# Patient Record
Sex: Male | Born: 1970
Health system: Southern US, Community
[De-identification: ages and names within clinical notes are randomized; demographics above are authoritative.]

## PROBLEM LIST (undated history)

## (undated) DIAGNOSIS — T7840XA Allergy, unspecified, initial encounter: Secondary | ICD-10-CM

## (undated) HISTORY — PX: SUBDURAL HEMATOMA EVACUATION VIA CRANIOTOMY: SUR319

## (undated) HISTORY — DX: Allergy, unspecified, initial encounter: T78.40XA

---

## 2011-08-11 ENCOUNTER — Ambulatory Visit (INDEPENDENT_AMBULATORY_CARE_PROVIDER_SITE_OTHER): Payer: 59

## 2011-08-11 DIAGNOSIS — R1084 Generalized abdominal pain: Secondary | ICD-10-CM

## 2011-08-11 DIAGNOSIS — K5900 Constipation, unspecified: Secondary | ICD-10-CM

## 2012-03-12 ENCOUNTER — Ambulatory Visit: Payer: 59

## 2012-03-12 ENCOUNTER — Ambulatory Visit (INDEPENDENT_AMBULATORY_CARE_PROVIDER_SITE_OTHER): Payer: 59 | Admitting: Family Medicine

## 2012-03-12 VITALS — BP 102/64 | HR 94 | Temp 99.6°F | Resp 16 | Ht 73.5 in | Wt 178.8 lb

## 2012-03-12 DIAGNOSIS — L02619 Cutaneous abscess of unspecified foot: Secondary | ICD-10-CM

## 2012-03-12 DIAGNOSIS — L03116 Cellulitis of left lower limb: Secondary | ICD-10-CM

## 2012-03-12 DIAGNOSIS — T6391XA Toxic effect of contact with unspecified venomous animal, accidental (unintentional), initial encounter: Secondary | ICD-10-CM

## 2012-03-12 LAB — POCT CBC
Granulocyte percent: 75.9 %G (ref 37–80)
HCT, POC: 46.2 % (ref 43.5–53.7)
Hemoglobin: 14.4 g/dL (ref 14.1–18.1)
Lymph, poc: 1.5 (ref 0.6–3.4)
MCV: 96.6 fL (ref 80–97)
POC Granulocyte: 7.1 — AB (ref 2–6.9)
POC LYMPH PERCENT: 16.1 %L (ref 10–50)

## 2012-03-12 MED ORDER — DOXYCYCLINE HYCLATE 100 MG PO TABS: 100.0000 mg | ORAL_TABLET | Freq: Two times a day (BID) | ORAL | Status: AC

## 2012-03-12 MED ORDER — LEVOFLOXACIN 500 MG PO TABS: 500.0000 mg | ORAL_TABLET | Freq: Every day | ORAL | Status: AC

## 2012-03-12 MED ORDER — CLINDAMYCIN HCL 150 MG PO CAPS: 150.0000 mg | ORAL_CAPSULE | Freq: Three times a day (TID) | ORAL | Status: AC

## 2012-03-12 NOTE — Progress Notes (Signed)
Subjective: Patient [place] the beach [place] and walking to the store. After about 5 minutes case stain on his foot. He thinks a sting ray got him with its tail.  He did well for most of the week, then yesterday started getting swollen and red. He got more painful. He had initial pain but that subsided.  Objective: 4 mm puncture wound on his foot. He has redness of much of the foot up to the malleolus. No radiating up the leg. Does not feel like it is abscess enough to benefit from an I&D. Assessment: Stingray sting with secondary cellulitis  Plan: X-ray soft tissues CBC Photograph Cultured the drainage Treat with triple therapy per Up to Date. (clindamycin plus levofloxacin. Patients with seawater exposure should also receive doxycycline for coverage of Vibrio species) Return in 2 days, sooner if worse Tetanus is utd.  [facility] reading (PRIMARY) by  Dr. Alwyn Ren No FB seen on medial aspect of foot where entry wound is.  There is a little radioopaque area on the dorsum of the foot, but this is not where the puncture wound is. .  Results for orders placed in visit on 03/12/12  POCT CBC      Component Value Range   WBC 9.3  4.6 - 10.2 K/uL   Lymph, poc 1.5  0.6 - 3.4   POC LYMPH PERCENT 16.1  10 - 50 %L   MID (cbc) 0.7  0 - 0.9   POC MID % 8.0  0 - 12 %M   POC Granulocyte 7.1 (*) 2 - 6.9   Granulocyte percent 75.9  37 - 80 %G   RBC 4.78  4.69 - 6.13 M/uL   Hemoglobin 14.4  14.1 - 18.1 g/dL   HCT, POC 47.8  29.5 - 53.7 %   MCV 96.6  80 - 97 fL   MCH, POC 30.1  27 - 31.2 pg   MCHC 31.2 (*) 31.8 - 35.4 g/dL   RDW, POC 62.1     Platelet Count, POC 222  142 - 424 K/uL   MPV 8.4  0 - 99.8 fL

## 2012-03-12 NOTE — Patient Instructions (Addendum)
Return at any time if abruptly worse. Return Thursday for a recheck.

## 2012-03-14 ENCOUNTER — Encounter: Payer: Self-pay | Admitting: Family Medicine

## 2012-03-15 LAB — WOUND CULTURE
Gram Stain: NONE SEEN
Gram Stain: NONE SEEN

## 2012-03-31 ENCOUNTER — Ambulatory Visit (INDEPENDENT_AMBULATORY_CARE_PROVIDER_SITE_OTHER): Payer: 59 | Admitting: Emergency Medicine

## 2012-03-31 VITALS — BP 99/70 | HR 70 | Temp 98.1°F | Resp 14 | Ht 73.5 in | Wt 176.0 lb

## 2012-03-31 DIAGNOSIS — L089 Local infection of the skin and subcutaneous tissue, unspecified: Secondary | ICD-10-CM

## 2012-03-31 MED ORDER — CIPROFLOXACIN HCL 500 MG PO TABS: 500.0000 mg | ORAL_TABLET | Freq: Two times a day (BID) | ORAL | Status: AC

## 2012-03-31 NOTE — Progress Notes (Signed)
   Date:  03/31/2012   Name:  Chad Griffin   DOB:  06-03-1971   MRN:  [id number] Gender: male Age: 41 y.o.  PCP:  Tonye Pearson, MD    Chief Complaint: Cellulitis   History of Present Illness:  Chad Griffin is a 41 y.o. pleasant patient who presents with the following:  Treated by Dr Alwyn Ren a week after stepping on a stingray at the beach.  Treated with a combination of levaquin, clindamycin, and doxycycline.  Now has increased erythema and some sero-sanguinous drainage from the wound today.  Has completed his antibiotic course 1 1/2 weeks ago.  No fever or chills, no swelling or tenderness.  Is concerned by the drainage and increased redness.    There is no problem list on file for this patient.   No past medical history on file.  No past surgical history on file.  History  Substance Use Topics  . Smoking status: Never Smoker   . Smokeless tobacco: Not on file  . Alcohol Use: Not on file    No family history on file.  Allergies  Allergen Reactions  . Penicillins Swelling    Medication list has been reviewed and updated.  No current outpatient prescriptions on file prior to visit.    Review of Systems:  As per HPI, otherwise negative.    Physical Examination: Filed Vitals:   03/31/12 1225  BP: 99/70  Pulse: 70  Temp: 98.1 F (36.7 C)  Resp: 14   Filed Vitals:   03/31/12 1225  Height: 6' 1.5" (1.867 m)  Weight: 176 lb (79.833 kg)   Body mass index is 22.91 kg/(m^2). Ideal Body Weight: Weight in (lb) to have BMI = 25: 191.7    GEN: WDWN, NAD, Non-toxic, Alert & Oriented x 3 HEENT: Atraumatic, Normocephalic.  Ears and Nose: No external deformity. EXTR: No clubbing/cyanosis/edema NEURO: Normal gait.  PSYCH: Normally interactive. Conversant. Not depressed or anxious appearing.  Calm demeanor.  Left Foot:  No cellulitis or lymphangitis.  Some erythema but no fluctuance or tenderness.  No mass.  No drainage.   Assessment and  Plan: Stingray sting cipro Follow up as needed  Had C&S positive for staph sensitive to levaquin, doxycycline, and clindamycin.  Had xray negative for foreign body.  Will give course of cipro as it is easier on the stomach and effective against V.vulnificans.  Chad Dane, MD I have reviewed and agree with documentation. Robert P. Merla Riches, M.D.

## 2012-04-23 DIAGNOSIS — Z0271 Encounter for disability determination: Secondary | ICD-10-CM

## 2012-07-13 ENCOUNTER — Ambulatory Visit (INDEPENDENT_AMBULATORY_CARE_PROVIDER_SITE_OTHER): Payer: 59 | Admitting: Family Medicine

## 2012-07-13 VITALS — BP 104/68 | HR 88 | Temp 98.9°F | Resp 18 | Ht 73.5 in | Wt 171.4 lb

## 2012-07-13 DIAGNOSIS — R05 Cough: Secondary | ICD-10-CM

## 2012-07-13 DIAGNOSIS — J029 Acute pharyngitis, unspecified: Secondary | ICD-10-CM

## 2012-07-13 DIAGNOSIS — R059 Cough, unspecified: Secondary | ICD-10-CM

## 2012-07-13 LAB — POCT RAPID STREP A (OFFICE): Rapid Strep A Screen: POSITIVE — AB

## 2012-07-13 MED ORDER — AZITHROMYCIN 250 MG PO TABS: ORAL_TABLET | ORAL | Status: DC

## 2012-07-13 NOTE — Progress Notes (Signed)
[facility] [address], [place] [address] [phone]  Date:  07/13/2012   Name:  Chad Griffin   DOB:  March 26, 1971   MRN:  [id number]  PCP:  Tonye Pearson, MD    Chief Complaint: Cough, Sore Throat and Fever   History of Present Illness:  Chad Griffin is a 41 y.o. very pleasant male patient who presents with the following:  He has had a cough for about a month- maybe even longer.  The cough can be productive.   Last night he noted onset of a ST.  His daughter had strep a few weeks ago.   He felt feverish last night.  He took ibuprofen. Also took it this morning.    He has a bit of body aches and chills He has some nasal congestion but nothing serious   No GI symptoms.    He is generally healthy.    There is no problem list on file for this patient.   No past medical history on file.  No past surgical history on file.  History  Substance Use Topics  . Smoking status: Never Smoker   . Smokeless tobacco: Not on file  . Alcohol Use: Not on file    No family history on file.  Allergies  Allergen Reactions  . Penicillins Swelling    Medication list has been reviewed and updated.  No current outpatient prescriptions on file prior to visit.    Review of Systems:  As per HPI- otherwise negative.   Physical Examination: Filed Vitals:   07/13/12 0848  BP: 104/68  Pulse: 88  Temp: 98.9 F (37.2 C)  Resp: 18   Filed Vitals:   07/13/12 0848  Height: 6' 1.5" (1.867 m)  Weight: 171 lb 6.4 oz (77.747 kg)   Body mass index is 22.31 kg/(m^2). Ideal Body Weight: Weight in (lb) to have BMI = 25: 191.7   GEN: WDWN, NAD, Non-toxic, A & O x 3 HEENT: Atraumatic, Normocephalic. Neck supple. No masses, No LAD. Bilateral TM wnl, oropharynx inflamed with exudate.  PEERL,EOMI.   Ears and Nose: No external deformity. CV: RRR, No M/G/R. No JVD. No thrill. No extra heart sounds. PULM: CTA B, no wheezes, crackles, rhonchi. No  retractions. No resp. distress. No accessory muscle use. ABD: S, NT, ND EXTR: No c/c/e NEURO Normal gait.  PSYCH: Normally interactive. Conversant. Not depressed or anxious appearing.  Calm demeanor.   Results for orders placed in visit on 07/13/12  POCT RAPID STREP A (OFFICE)      Component Value Range   Rapid Strep A Screen Positive (*) Negative     Assessment and Plan: 1. Pharyngitis  POCT rapid strep A, azithromycin (ZITHROMAX) 250 MG tablet  2. Cough     positive for strep throat today.  Treat with azithromycin as he is pcn allergic- this may also be helpful for his cough.  See patient instructions for more details.     Abbe Amsterdam, MD

## 2012-07-13 NOTE — Patient Instructions (Addendum)
You have strep throat.  Use the medication as directed. If you are not better within 48 hours please let us know. Happy holidays!

## 2012-08-05 ENCOUNTER — Ambulatory Visit (INDEPENDENT_AMBULATORY_CARE_PROVIDER_SITE_OTHER): Payer: 59 | Admitting: Physician Assistant

## 2012-08-05 VITALS — BP 142/80 | HR 68 | Temp 98.4°F | Resp 18 | Ht 73.0 in | Wt 173.0 lb

## 2012-08-05 DIAGNOSIS — J302 Other seasonal allergic rhinitis: Secondary | ICD-10-CM

## 2012-08-05 DIAGNOSIS — H6120 Impacted cerumen, unspecified ear: Secondary | ICD-10-CM

## 2012-08-05 DIAGNOSIS — H9190 Unspecified hearing loss, unspecified ear: Secondary | ICD-10-CM

## 2012-08-05 DIAGNOSIS — H6980 Other specified disorders of Eustachian tube, unspecified ear: Secondary | ICD-10-CM

## 2012-08-05 MED ORDER — FLUTICASONE PROPIONATE 50 MCG/ACT NA SUSP: 2.0000 | Freq: Every day | NASAL | Status: DC

## 2012-08-05 MED ORDER — IPRATROPIUM BROMIDE 0.06 % NA SOLN: 2.0000 | Freq: Three times a day (TID) | NASAL | Status: DC

## 2012-08-05 MED ORDER — PREDNISONE 20 MG PO TABS: ORAL_TABLET | ORAL | Status: DC

## 2012-08-05 NOTE — Progress Notes (Signed)
Patient ID: Chad Griffin MRN: [id number], DOB: July 22, 1971, 42 y.o. Date of Encounter: 08/05/2012, 5:33 PM  Primary Physician: Tonye Pearson, MD  Chief Complaint: Left ear feels full  HPI: 42 y.o. year old male with history below presents with left ear fullness. Patient states that his left ear has been on and off with a full sensation for about the past week. During this time he has noticed an increase in the about of nasal congestion and post nasal drip. He was able to get a fair amount of wax out of the ear with some Debrox, however the fullness sensation remained. When he leaned over a warm coffee mug full of hot water he noticed the ear would pop and he would be able to hear for a little while then the muffled sensation would return. He does have some high pitched tinnitus in the left ear. Never with any drainage or discharge from the ear. He does not use Q tips. Normal hearing along the right ear. No pain in either ear. Has remained afebrile. No cough. No dizziness or vertigo symptoms. No rashes, blisters, or lesions.    No past medical history on file.   Home Meds: Prior to Admission medications   Medication Sig Start Date End Date Taking? Authorizing Provider                         Allergies:  Allergies  Allergen Reactions  . Penicillins Swelling    History   Social History  . Marital Status: Single    Spouse Name: N/A    Number of Children: N/A  . Years of Education: N/A   Occupational History  . Not on file.   Social History Main Topics  . Smoking status: Never Smoker   . Smokeless tobacco: Not on file  . Alcohol Use: Not on file  . Drug Use: Not on file  . Sexually Active: Not on file   Other Topics Concern  . Not on file   Social History Narrative  . No narrative on file     Review of Systems: Constitutional: negative for chills, fever, night sweats, weight changes, or fatigue  HEENT: see above  Cardiovascular: negative for chest pain or  palpitations Respiratory: negative for wheezing, shortness of breath, or cough Abdominal: negative for abdominal pain, nausea, vomiting, or diarrhea Dermatological: negative for rash Neurologic: negative for headache, vertigo, dizziness, or syncope All other systems reviewed and are otherwise negative with the exception to those above and in the HPI.   Physical Exam: Blood pressure 142/80, pulse 68, temperature 98.4 F (36.9 C), resp. rate 18, height 6\' 1"  (1.854 m), weight 173 lb (78.472 kg), SpO2 100.00%., Body mass index is 22.82 kg/(m^2). General: Well developed, well nourished, in no acute distress. Head: Normocephalic, atraumatic, eyes without discharge, sclera non-icteric, nares are without discharge. Right auditory canal with cerumen impaction. Left auditory canal clear. Status post ear lavage TM's are without perforation, pearly grey and translucent with reflective cone of light bilaterally. Left TM with serous effusion behind. No signs of infection. Oral cavity moist, posterior pharynx without exudate, erythema, or peritonsillar abscess. Mild post nasal drip.  Neck: Supple. No thyromegaly. Full ROM. No lymphadenopathy. Lungs: Clear bilaterally to auscultation without wheezes, rales, or rhonchi. Breathing is unlabored. Heart: RRR with S1 S2. No murmurs, rubs, or gallops appreciated. Msk:  Strength and tone normal for age. Extremities/Skin: Warm and dry. No clubbing or cyanosis. No edema. No rashes or  suspicious lesions. Neuro: Alert and oriented X 3. Moves all extremities spontaneously. Gait is normal. CNII-XII grossly in tact. Psych:  Responds to questions appropriately with a normal affect.   Ear lavage performed by Oswaldo Milian.  ASSESSMENT AND PLAN:  42 y.o. year old male with eustachian tube dysfunction and cerumen impaction -Ear lavage per above -Prednisone 20 mg #18 3x3, 2x3, 1x3 no RF -Atrovent NS 0.06% 2 sprays each nare bid prn #1 RF 6 -Flonase 2 sprays each nare daily  #1 RF 6 -Can add in Benadryl, Zyrtec, or Allegra prn -Box spring and mattress covers -Remove excess pillows  -Teaspoon of local honey daily -Regular floor cleaning -Better air filters with regular changing -RTC prn    Signed, Eula Listen, PA-C 08/05/2012 5:33 PM

## 2012-10-26 ENCOUNTER — Ambulatory Visit: Payer: 59

## 2012-10-26 ENCOUNTER — Other Ambulatory Visit: Payer: Self-pay | Admitting: Family Medicine

## 2012-10-26 ENCOUNTER — Ambulatory Visit (INDEPENDENT_AMBULATORY_CARE_PROVIDER_SITE_OTHER): Payer: 59 | Admitting: Family Medicine

## 2012-10-26 VITALS — BP 112/78 | HR 65 | Temp 98.9°F | Resp 16 | Ht 74.0 in | Wt 179.0 lb

## 2012-10-26 DIAGNOSIS — R1902 Left upper quadrant abdominal swelling, mass and lump: Secondary | ICD-10-CM

## 2012-10-26 LAB — POCT CBC
HCT, POC: 42.7 % — AB (ref 43.5–53.7)
Hemoglobin: 13.7 g/dL — AB (ref 14.1–18.1)
Lymph, poc: 2 (ref 0.6–3.4)
MCH, POC: 30.5 pg (ref 27–31.2)
MCHC: 32.1 g/dL (ref 31.8–35.4)
POC Granulocyte: 3.6 (ref 2–6.9)
WBC: 6.1 10*3/uL (ref 4.6–10.2)

## 2012-10-26 NOTE — Patient Instructions (Addendum)
We will schedule an ultrasound of your abdomen, and I will be in touch with the rest of your labs.  If any change in the meantime please let us know

## 2012-10-26 NOTE — Progress Notes (Signed)
[facility] [address] [place] [address] [phone]  Date:  10/26/2012   Name:  Chad Griffin   DOB:  1971-07-17   MRN:  [id number]  PCP:  Tonye Pearson, MD    Chief Complaint: Abdominal Pain   History of Present Illness:  Chad Griffin is a 42 y.o. very pleasant male patient who presents with the following:  He is here today to evaluate a "strange sensation" in his left upper abdomen.  He thinks there could be a mass or enlargement there. He feels it sometimes if he moves or when he lies down and presses on his belly at night.  It has been present for about 2 months.  Sometimes he cannot really feel it, sometimes he can.   He has not noted weight loss- has gained a few lbs as of late. He has noted some minor constipation alternating with loose stools He has been eating normally.  Otherwise he feels fine.  He is generally healthy     There is no problem list on file for this patient.   No past medical history on file.  No past surgical history on file.  History  Substance Use Topics  . Smoking status: Never Smoker   . Smokeless tobacco: Never Used  . Alcohol Use: Not on file    No family history on file.  Allergies  Allergen Reactions  . Penicillins Swelling    Medication list has been reviewed and updated.  Current Outpatient Prescriptions on File Prior to Visit  Medication Sig Dispense Refill  . fluticasone (FLONASE) 50 MCG/ACT nasal spray Place 2 sprays into the nose daily.  16 g  6  . ipratropium (ATROVENT) 0.06 % nasal spray Place 2 sprays into the nose 3 (three) times daily.  15 mL  6  . predniSONE (DELTASONE) 20 MG tablet 3 PO FOR 3 DAYS, 2 PO FOR 3 DAYS, 1 PO FOR 3 DAYS  18 tablet  0   No current facility-administered medications on file prior to visit.    Review of Systems:  As per HPI- otherwise negative.   Physical Examination: Filed Vitals:   10/26/12 1611  BP: 112/78  Pulse: 65  Temp: 98.9 F (37.2 C)   Resp: 16   Filed Vitals:   10/26/12 1611  Height: 6\' 2"  (1.88 m)  Weight: 179 lb (81.194 kg)   Body mass index is 22.97 kg/(m^2). Ideal Body Weight: Weight in (lb) to have BMI = 25: 194.3  GEN: WDWN, NAD, Non-toxic, A & O x , looks well HEENT: Atraumatic, Normocephalic. Neck supple. No masses, No LAD.  Bilateral TM wnl, oropharynx normal.  PEERL,EOMI.   Ears and Nose: No external deformity. CV: RRR, No M/G/R. No JVD. No thrill. No extra heart sounds. PULM: CTA B, no wheezes, crackles, rhonchi. No retractions. No resp. distress. No accessory muscle use. ABD: S, NT, ND, +BS. No rebound. No HSM.  No palpable masses or abnormality EXTR: No c/c/e NEURO Normal gait.  PSYCH: Normally interactive. Conversant. Not depressed or anxious appearing.  Calm demeanor.   [facility] reading (PRIMARY) by  Dr. Patsy Lager.  Pt is concerned that he can feel a mass in his LUQ- no abnormality on exam CXR:  Negative one view, no masses apparent Abdominal film:  Normal   CHEST - 2 VIEW  Comparison: None  Findings: The heart size and mediastinal contours are within normal limits. Both lungs are clear. The visualized skeletal structures are unremarkable.  IMPRESSION:  No active disease  ABDOMEN - 1 VIEW  Comparison: 08/11/2011  Findings: The bowel gas pattern is within normal limits. No evidence of bowel obstruction. No soft tissue abnormalities or obvious mass lesions. No abnormal calcifications are visualized. Bony structures are within normal limits.  IMPRESSION: Normal abdominal film.   Results for orders placed in visit on 10/26/12  POCT CBC      Result Value Range   WBC 6.1  4.6 - 10.2 K/uL   Lymph, poc 2.0  0.6 - 3.4   POC LYMPH PERCENT 32.7  10 - 50 %L   MID (cbc) 0.5  0 - 0.9   POC MID % 7.9  0 - 12 %M   POC Granulocyte 3.6  2 - 6.9   Granulocyte percent 59.4  37 - 80 %G   RBC 4.49 (*) 4.69 - 6.13 M/uL   Hemoglobin 13.7 (*) 14.1 - 18.1 g/dL   HCT, POC 16.1 (*) 09.6 - 53.7 %   MCV  95.0  80 - 97 fL   MCH, POC 30.5  27 - 31.2 pg   MCHC 32.1  31.8 - 35.4 g/dL   RDW, POC 04.5     Platelet Count, POC 211  142 - 424 K/uL   MPV 8.2  0 - 99.8 fL    Assessment and Plan: Abdominal mass, LUQ (left upper quadrant) - Plan: POCT CBC, Comprehensive metabolic panel, Amylase, Lipase, DG Abd 1 View, US Abdomen Complete, CANCELED: DG Chest 1 View  Will await labs and ultrasound.  No obvious mass on exam. If ultrasound negative and concerns persist we can do a CT.    Signed Abbe Amsterdam, MD

## 2012-10-27 LAB — LIPASE: Lipase: 28 U/L (ref 0–75)

## 2012-10-27 LAB — COMPREHENSIVE METABOLIC PANEL
CO2: 26 mEq/L (ref 19–32)
Glucose, Bld: 92 mg/dL (ref 70–99)
Sodium: 139 mEq/L (ref 135–145)
Total Bilirubin: 0.9 mg/dL (ref 0.3–1.2)
Total Protein: 7.4 g/dL (ref 6.0–8.3)

## 2012-10-27 LAB — AMYLASE: Amylase: 36 U/L (ref 0–105)

## 2012-10-28 ENCOUNTER — Telehealth: Payer: Self-pay | Admitting: Radiology

## 2012-10-28 NOTE — Telephone Encounter (Signed)
lmom of normal results. 

## 2012-11-01 ENCOUNTER — Other Ambulatory Visit: Payer: Self-pay

## 2012-11-01 ENCOUNTER — Ambulatory Visit
Admission: RE | Admit: 2012-11-01 | Discharge: 2012-11-01 | Disposition: A | Discharge status: Home / Self Care | Payer: 59 | Source: Ambulatory Visit | Attending: Family Medicine | Admitting: Family Medicine

## 2012-11-01 ENCOUNTER — Telehealth: Payer: Self-pay | Admitting: Family Medicine

## 2012-11-01 DIAGNOSIS — R1902 Left upper quadrant abdominal swelling, mass and lump: Secondary | ICD-10-CM

## 2012-11-01 NOTE — Telephone Encounter (Signed)
Called and communicated negative ultrasound results to his wife this evening

## 2012-11-03 ENCOUNTER — Telehealth: Payer: Self-pay | Admitting: Family Medicine

## 2012-11-03 DIAGNOSIS — D649 Anemia, unspecified: Secondary | ICD-10-CM

## 2012-11-03 NOTE — Telephone Encounter (Signed)
Called no answer

## 2012-11-07 ENCOUNTER — Telehealth: Payer: Self-pay

## 2012-11-07 NOTE — Telephone Encounter (Signed)
Looks like you have been trying to call him.

## 2012-11-07 NOTE — Telephone Encounter (Signed)
STATES DR COPLAND SENT HIM FOR AN ULTRA SOUND AND WAS TOLD IT WAS NORMAL, HOWEVER HE WOULD LIKE TO KNOW WHAT THE NEXT STEP IS PLEASE CALL [phone]

## 2012-11-07 NOTE — Telephone Encounter (Signed)
Called him back at number given- no answer but left detailed message.  Letter is in the mail to him with stool cards, should arrive soon with plan for next step.  Call if any other questions

## 2012-11-08 ENCOUNTER — Other Ambulatory Visit (INDEPENDENT_AMBULATORY_CARE_PROVIDER_SITE_OTHER): Payer: 59 | Admitting: Family Medicine

## 2012-11-08 ENCOUNTER — Other Ambulatory Visit: Payer: 59

## 2012-11-08 DIAGNOSIS — D649 Anemia, unspecified: Secondary | ICD-10-CM

## 2012-11-08 LAB — POCT CBC
HCT, POC: 45.9 % (ref 43.5–53.7)
Hemoglobin: 14.5 g/dL (ref 14.1–18.1)
MCH, POC: 30.1 pg (ref 27–31.2)
MCV: 95.2 fL (ref 80–97)
RBC: 4.82 M/uL (ref 4.69–6.13)
WBC: 5.5 10*3/uL (ref 4.6–10.2)

## 2012-11-08 LAB — IFOBT (OCCULT BLOOD): IFOBT: NEGATIVE

## 2012-11-08 NOTE — Progress Notes (Signed)
Patient here today for Lab only. 

## 2012-11-11 ENCOUNTER — Encounter: Payer: Self-pay | Admitting: Family Medicine

## 2012-11-11 ENCOUNTER — Other Ambulatory Visit: Payer: Self-pay | Admitting: Family Medicine

## 2012-11-11 DIAGNOSIS — R1900 Intra-abdominal and pelvic swelling, mass and lump, unspecified site: Secondary | ICD-10-CM

## 2012-11-11 NOTE — Telephone Encounter (Signed)
Called radiologist and discussed- he did feel there would be utility to a performing a CT scan.  Discussed with pt and he would like to proceed- discussed possible radiation risks.  Will schedule CT abdomen and pelvis in the next week or two

## 2012-11-18 ENCOUNTER — Ambulatory Visit
Admission: RE | Admit: 2012-11-18 | Discharge: 2012-11-18 | Disposition: A | Discharge status: Home / Self Care | Payer: 59 | Source: Ambulatory Visit | Attending: Family Medicine | Admitting: Family Medicine

## 2012-11-18 DIAGNOSIS — R1900 Intra-abdominal and pelvic swelling, mass and lump, unspecified site: Secondary | ICD-10-CM

## 2012-11-18 MED ORDER — IOHEXOL 300 MG/ML  SOLN
100.0000 mL | Freq: Once | INTRAMUSCULAR | Status: AC | PRN
  Administered 2012-11-18: 100 mL via INTRAVENOUS

## 2012-11-19 ENCOUNTER — Telehealth: Payer: Self-pay | Admitting: Family Medicine

## 2012-11-19 NOTE — Telephone Encounter (Signed)
Received his CT result- negative result.  LMOM.  Will try him back later

## 2012-11-20 ENCOUNTER — Telehealth: Payer: Self-pay

## 2012-11-20 NOTE — Telephone Encounter (Signed)
Called him, CT normal, how is he? His number is [phone] left message for him to call me back.

## 2012-11-20 NOTE — Telephone Encounter (Signed)
Pt is returning Dr. Brayton Layman call Call back number is [phone]

## 2012-11-20 NOTE — Telephone Encounter (Signed)
Spoke to patient, he states he is about the same, I did advise him the scan was normal. He is asking for you to call him back. He states he will be available to answer the phone this afternoon

## 2012-11-20 NOTE — Telephone Encounter (Signed)
Called twice today but could not reach him yet.  Will try again tomorrow

## 2012-11-20 NOTE — Telephone Encounter (Signed)
Called him back again- LMOM.  CT normal, if any questions please call, I will be in all day today

## 2012-11-21 NOTE — Telephone Encounter (Signed)
Called no answer

## 2012-11-22 NOTE — Telephone Encounter (Signed)
Called cell phone but no answer.  Called home, no answer, LMOM

## 2012-11-25 ENCOUNTER — Telehealth: Payer: Self-pay | Admitting: Family Medicine

## 2012-11-25 NOTE — Telephone Encounter (Signed)
Was able to speak with him- he called back.  His phone has not been ringing when we call.  He feels reassured that his CT is normal and hopes that this knowledge may allow him to not focus on the feeling of a mass anymore. (he wonders how much is real and how much is anxiety.)   If he continues to notice it over the next 2- 4 weeks he is to call me and I will refer him to GI

## 2012-11-25 NOTE — Telephone Encounter (Signed)
Called and LMOM.  I will send him a letter

## 2013-07-26 ENCOUNTER — Ambulatory Visit (INDEPENDENT_AMBULATORY_CARE_PROVIDER_SITE_OTHER): Payer: 59 | Admitting: Emergency Medicine

## 2013-07-26 VITALS — BP 108/78 | HR 95 | Temp 99.9°F | Resp 16 | Ht 73.0 in | Wt 184.0 lb

## 2013-07-26 DIAGNOSIS — J029 Acute pharyngitis, unspecified: Secondary | ICD-10-CM

## 2013-07-26 DIAGNOSIS — J020 Streptococcal pharyngitis: Secondary | ICD-10-CM

## 2013-07-26 LAB — POCT RAPID STREP A (OFFICE): Rapid Strep A Screen: POSITIVE — AB

## 2013-07-26 LAB — POCT INFLUENZA A/B
INFLUENZA A, POC: NEGATIVE
Influenza B, POC: NEGATIVE

## 2013-07-26 MED ORDER — CLINDAMYCIN HCL 300 MG PO CAPS: 300.0000 mg | ORAL_CAPSULE | Freq: Three times a day (TID) | ORAL | Status: DC

## 2013-07-26 NOTE — Progress Notes (Signed)
   Subjective:    Patient ID: Chad Griffin, male    DOB: 1971-02-08, 43 y.o.   MRN: [id number] This chart was scribed for Arlyss Queen, MD by Vernell Barrier, Medical Scribe. This patient's care was started at 10:43 AM.  HPI HPI Comments: Chad Griffin is a 43 y.o. male who presents to the [facility] complaining of sore throat, onset 1 day ago. Pt states he has also had some head congestion, ear pain, and neck pain for the past couple of days. Pt states he feels like has some swollen lymph nodes in his armpits. Pt reported mild abdominal discomfort upon palpation. Pt denies cough. Pt has several sick contacts at home.   Review of Systems  HENT: Positive for congestion, ear pain and sore throat.   Respiratory: Negative for cough.   Musculoskeletal: Positive for neck pain.      Objective:   Physical Exam  CONSTITUTIONAL: Well developed/well nourished HEAD: Normocephalic/atraumatic EYES: EOMI/PERRL ENMT: Mucous membranes moist NECK: supple no meningeal signs SPINE:entire spine nontender CV: S1/S2 noted, no murmurs/rubs/gallops noted LUNGS: Lungs are clear to auscultation bilaterally, no apparent distress ABDOMEN: soft, nontender, no rebound or guarding GU:no cva tenderness NEURO: Pt is awake/alert, moves all extremitiesx4 EXTREMITIES: pulses normal, full ROM SKIN: warm, color normal PSYCH: no abnormalities of mood noted  Results for orders placed in visit on 07/26/13  POCT RAPID STREP A (OFFICE)      Result Value Range   Rapid Strep A Screen Positive (*) Negative  POCT INFLUENZA A/B      Result Value Range   Influenza A, POC Negative     Influenza B, POC Negative         Assessment & Plan:  Treatments Cleocin 3 times a day due to his penicillin allergy he will take this for 10 days I personally performed the services described in this documentation, which was scribed in my presence. The recorded information has been reviewed and is accurate.

## 2013-07-26 NOTE — Patient Instructions (Signed)
Strep Throat  Strep throat is an infection of the throat caused by a bacteria named Streptococcus pyogenes. Your caregiver may call the infection streptococcal "tonsillitis" or "pharyngitis" depending on whether there are signs of inflammation in the tonsils or back of the throat. Strep throat is most common in children aged 41 15 years during the cold months of the year, but it can occur in people of any age during any season. This infection is spread from person to person (contagious) through coughing, sneezing, or other close contact.  SYMPTOMS   · Fever or chills.  · Painful, swollen, red tonsils or throat.  · Pain or difficulty when swallowing.  · White or yellow spots on the tonsils or throat.  · Swollen, tender lymph nodes or "glands" of the neck or under the jaw.  · Red rash all over the body (rare).  DIAGNOSIS   Many different infections can cause the same symptoms. A test must be done to confirm the diagnosis so the right treatment can be given. A "rapid strep test" can help your caregiver make the diagnosis in a few minutes. If this test is not available, a light swab of the infected area can be used for a throat culture test. If a throat culture test is done, results are usually available in a day or two.  TREATMENT   Strep throat is treated with antibiotic medicine.  HOME CARE INSTRUCTIONS   · Gargle with 1 tsp of salt in 1 cup of warm water, 3 4 times per day or as needed for comfort.  · Family members who also have a sore throat or fever should be tested for strep throat and treated with antibiotics if they have the strep infection.  · Make sure everyone in your household washes their hands well.  · Do not share food, drinking cups, or personal items that could cause the infection to spread to others.  · You may need to eat a soft food diet until your sore throat gets better.  · Drink enough water and fluids to keep your urine clear or pale yellow. This will help prevent dehydration.  · Get plenty of  rest.  · Stay home from school, daycare, or work until you have been on antibiotics for 24 hours.  · Only take over-the-counter or prescription medicines for pain, discomfort, or fever as directed by your caregiver.  · If antibiotics are prescribed, take them as directed. Finish them even if you start to feel better.  SEEK MEDICAL CARE IF:   · The glands in your neck continue to enlarge.  · You develop a rash, cough, or earache.  · You cough up green, yellow-brown, or bloody sputum.  · You have pain or discomfort not controlled by medicines.  · Your problems seem to be getting worse rather than better.  SEEK IMMEDIATE MEDICAL CARE IF:   · You develop any new symptoms such as vomiting, severe headache, stiff or painful neck, chest pain, shortness of breath, or trouble swallowing.  · You develop severe throat pain, drooling, or changes in your voice.  · You develop swelling of the neck, or the skin on the neck becomes red and tender.  · You have a fever.  · You develop signs of dehydration, such as fatigue, dry mouth, and decreased urination.  · You become increasingly sleepy, or you cannot wake up completely.  Document Released: 07/07/2000 Document Revised: 06/26/2012 Document Reviewed: 09/08/2010  [employer]® Patient Information ©2014 [employer].

## 2013-08-01 ENCOUNTER — Ambulatory Visit (INDEPENDENT_AMBULATORY_CARE_PROVIDER_SITE_OTHER): Payer: 59 | Admitting: Family Medicine

## 2013-08-01 VITALS — BP 118/70 | HR 61 | Temp 98.0°F | Resp 16 | Ht 73.5 in | Wt 182.0 lb

## 2013-08-01 DIAGNOSIS — F439 Reaction to severe stress, unspecified: Secondary | ICD-10-CM

## 2013-08-01 DIAGNOSIS — L989 Disorder of the skin and subcutaneous tissue, unspecified: Secondary | ICD-10-CM

## 2013-08-01 DIAGNOSIS — Z0000 Encounter for general adult medical examination without abnormal findings: Secondary | ICD-10-CM

## 2013-08-01 DIAGNOSIS — Z733 Stress, not elsewhere classified: Secondary | ICD-10-CM

## 2013-08-01 LAB — POCT CBC
Granulocyte percent: 60 %G (ref 37–80)
HCT, POC: 45.7 % (ref 43.5–53.7)
Hemoglobin: 14.4 g/dL (ref 14.1–18.1)
Lymph, poc: 1.7 (ref 0.6–3.4)
MCH, POC: 30.4 pg (ref 27–31.2)
MCHC: 31.5 g/dL — AB (ref 31.8–35.4)
MCV: 96.7 fL (ref 80–97)
MID (cbc): 0.4 (ref 0–0.9)
MPV: 8.6 fL (ref 0–99.8)
POC Granulocyte: 3.1 (ref 2–6.9)
POC LYMPH PERCENT: 32.5 %L (ref 10–50)
POC MID %: 7.5 %M (ref 0–12)
Platelet Count, POC: 225 10*3/uL (ref 142–424)
RBC: 4.73 M/uL (ref 4.69–6.13)
RDW, POC: 13.2 %
WBC: 5.2 10*3/uL (ref 4.6–10.2)

## 2013-08-01 LAB — IFOBT (OCCULT BLOOD): IMMUNOLOGICAL FECAL OCCULT BLOOD TEST: NEGATIVE

## 2013-08-01 LAB — LIPID PANEL
CHOLESTEROL: 178 mg/dL (ref 0–200)
HDL: 43 mg/dL (ref >39)
LDL CALC: 124 mg/dL — AB (ref 0–99)
TRIGLYCERIDES: 53 mg/dL (ref <150)
Total CHOL/HDL Ratio: 4.1 Ratio
VLDL: 11 mg/dL (ref 0–40)

## 2013-08-01 LAB — COMPREHENSIVE METABOLIC PANEL
ALBUMIN: 4.2 g/dL (ref 3.5–5.2)
ALK PHOS: 44 U/L (ref 39–117)
ALT: 10 U/L (ref 0–53)
AST: 17 U/L (ref 0–37)
BUN: 13 mg/dL (ref 6–23)
CO2: 26 meq/L (ref 19–32)
Calcium: 9.6 mg/dL (ref 8.4–10.5)
Chloride: 102 mEq/L (ref 96–112)
Creat: 1 mg/dL (ref 0.50–1.35)
GLUCOSE: 81 mg/dL (ref 70–99)
POTASSIUM: 4.5 meq/L (ref 3.5–5.3)
Sodium: 137 mEq/L (ref 135–145)
Total Bilirubin: 1 mg/dL (ref 0.3–1.2)
Total Protein: 7.3 g/dL (ref 6.0–8.3)

## 2013-08-01 LAB — TSH: TSH: 1.646 u[IU]/mL (ref 0.350–4.500)

## 2013-08-01 LAB — POCT GLYCOSYLATED HEMOGLOBIN (HGB A1C): HEMOGLOBIN A1C: 5.2

## 2013-08-01 NOTE — Patient Instructions (Signed)
We will let you know the results of your pathology.  Keep a Band-Aid on the place where we removed the skin lesions for the next 5 or 6 days. A crust will form and then it will gradually heal in.  Get regular exercise  Return for an annual physical.  Continue to try to make decisions to live a healthy lifestyle

## 2013-08-01 NOTE — Progress Notes (Signed)
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 He has no major acute complaints. He was just treated for a strep and is still on antibiotics and is doing much better.  Past history: Surgeries: Only on ingrown toenails Medical illnesses: None Allergies: Penicillin Regular medications: None. Currently finishing up his clindamycin prescription  Social history: College professor [employer]. Has a significant other, and she is his only sexual partner. He has 2 children. He does yoga but no other regular exercise. He realizes he needs some cardio type exercise.  Family history: Both parents are living. One grandparent had diabetes. Siblings: None  Review of systems: Constitutional: Unremarkable HEENT: Unremarkable Cardiovascular: Unremarkable except for occasional palpitations that he's had for many years Respiratory: Unremarkable Gastrointestinal: Unremarkable Genitourinary: A little bit of a delay of his urinary stream occasionally, not of major concern neurologic: Unremarkable Musculoskeletal: Unremarkable Dermatologic: Has a little skin lesion on his right flank that has grown up over the last year and a half or so he is concerned about. He to know whether need to come off or not. We had a long discussion about that. Endocrinologic: Unremarkable Neurologic: Unremarkable. He does get feeling stressed and that gives him a little chest wall tightness which goes away probably relaxants. It is not brought on by physical exertion, infected goes away with any exertion. Psychiatric: Unremarkable  Physical examination: Well-developed well-nourished white male in no acute distress. Has a beard. His TMs are normal. Eyes PERRLA. Fundi benign. Throat clear. Neck supple without nodes thyromegaly. No carotid bruits. Chest is clear to auscultation. Heart regular without murmurs gallops or arrhythmias.  And soft without mass or tenderness. Normal male external genitalia with testes descended. He is circumcised. No hernias. Extremities unremarkable. His daughter painted his toenails. On his right flank there is a 4 mm diameter pale slightly nodular skin lesion, flaking at the tip of it.  Assessment: Annual physical examination Mild palpitations Skin lesion right flank Stress  Plan: EKG, stool for occult blood, CBC, C. met, lipid, TSH  We will remove the skin lesion and procedure was explained to the patient. He understands.  Shave biopsy: Lesion was numbed with 1% lidocaine. Shave biopsy was performed. This was cauterized with silver nitrate. Patient tolerated procedure well  Discussed healthy lifestyle with him

## 2013-08-04 ENCOUNTER — Encounter: Payer: Self-pay | Admitting: Family Medicine

## 2014-02-11 ENCOUNTER — Ambulatory Visit (INDEPENDENT_AMBULATORY_CARE_PROVIDER_SITE_OTHER): Payer: 59 | Admitting: Family Medicine

## 2014-02-11 VITALS — BP 102/72 | HR 58 | Temp 98.2°F | Resp 16 | Ht 73.0 in | Wt 181.0 lb

## 2014-02-11 DIAGNOSIS — H60399 Other infective otitis externa, unspecified ear: Secondary | ICD-10-CM

## 2014-02-11 DIAGNOSIS — H9209 Otalgia, unspecified ear: Secondary | ICD-10-CM

## 2014-02-11 DIAGNOSIS — H60392 Other infective otitis externa, left ear: Secondary | ICD-10-CM

## 2014-02-11 DIAGNOSIS — H9202 Otalgia, left ear: Secondary | ICD-10-CM

## 2014-02-11 MED ORDER — CIPROFLOXACIN-DEXAMETHASONE 0.3-0.1 % OT SUSP: 4.0000 [drp] | Freq: Two times a day (BID) | OTIC | Status: DC

## 2014-02-11 MED ORDER — AZITHROMYCIN 250 MG PO TABS: ORAL_TABLET | ORAL | Status: DC

## 2014-02-11 NOTE — Progress Notes (Signed)
Chief Complaint:  Chief Complaint  Patient presents with  . Ear Fullness    started 3-4 days ago--left ear--dull pain-clear drainage-went swimming 3-4 days ago--can't hear well--left side of jaw hurts    HPI: Chad Griffin is a 43 y.o. male who is here for  3-4 day history of left ear paian dna jaw pain with clear dc , started after swimming, has tried some internet remedies/stuff over internet. He denies fevers or chills. Denies cough, URI sxs. HA, rashes  History reviewed. No pertinent past medical history. History reviewed. No pertinent past surgical history. History   Social History  . Marital Status: Single    Spouse Name: N/A    Number of Children: N/A  . Years of Education: N/A   Social History Main Topics  . Smoking status: Never Smoker   . Smokeless tobacco: Never Used  . Alcohol Use: No  . Drug Use: None  . Sexual Activity: None   Other Topics Concern  . None   Social History Narrative  . None   History reviewed. No pertinent family history. Allergies  Allergen Reactions  . Penicillins Swelling   Prior to Admission medications   Medication Sig Start Date End Date Taking? Authorizing Provider  fluticasone (FLONASE) 50 MCG/ACT nasal spray Place 2 sprays into the nose daily. 08/05/12  Yes Ryan M Dunn, PA-C  ipratropium (ATROVENT) 0.06 % nasal spray Place 2 sprays into the nose 3 (three) times daily. 08/05/12  Yes Ryan M Dunn, PA-C  clindamycin (CLEOCIN) 300 MG capsule Take 1 capsule (300 mg total) by mouth 3 (three) times daily. 07/26/13   Darlyne Russian, MD     ROS: The patient denies fevers, chills, night sweats, unintentional weight loss, chest pain, palpitations, wheezing, dyspnea on exertion, nausea, vomiting, abdominal pain, dysuria, hematuria, melena, numbness, weakness, or tingling.   All other systems have been reviewed and were otherwise negative with the exception of those mentioned in the HPI and as above.    PHYSICAL EXAM: Filed Vitals:   02/11/14 1616  BP: 102/72  Pulse: 58  Temp: 98.2 F (36.8 C)  Resp: 16   Filed Vitals:   02/11/14 1616  Height: 6\' 1"  (1.854 m)  Weight: 181 lb (82.101 kg)   Body mass index is 23.89 kg/(m^2).  General: Alert, no acute distress HEENT:  Normocephalic, atraumatic, oropharynx patent. EOMI, PERRLA, TM left  Is blocked, narrowed external ear canal/ erythematous canal. Tender on pull of tragus which is swollen Cardiovascular:  Sinus bradyno rubs murmurs or gallops.  Radial pulse intact. No pedal edema.  Respiratory: Clear to auscultation bilaterally.  No wheezes, rales, or rhonchi.  No cyanosis, no use of accessory musculature GI: No organomegaly, abdomen is soft and non-tender, positive bowel sounds.  No masses. Skin: No rashes. Neurologic: Facial musculature symmetric. Psychiatric: Patient is appropriate throughout our interaction. Musculoskeletal: Gait intact.   LABS: Results for orders placed in visit on 08/01/13  COMPREHENSIVE METABOLIC PANEL      Result Value Ref Range   Sodium 137  135 - 145 mEq/L   Potassium 4.5  3.5 - 5.3 mEq/L   Chloride 102  96 - 112 mEq/L   CO2 26  19 - 32 mEq/L   Glucose, Bld 81  70 - 99 mg/dL   BUN 13  6 - 23 mg/dL   Creat 1.00  0.50 - 1.35 mg/dL   Total Bilirubin 1.0  0.3 - 1.2 mg/dL   Alkaline Phosphatase 44  39 -  117 U/L   AST 17  0 - 37 U/L   ALT 10  0 - 53 U/L   Total Protein 7.3  6.0 - 8.3 g/dL   Albumin 4.2  3.5 - 5.2 g/dL   Calcium 9.6  8.4 - 10.5 mg/dL  LIPID PANEL      Result Value Ref Range   Cholesterol 178  0 - 200 mg/dL   Triglycerides 53  <150 mg/dL   HDL 43  >39 mg/dL   Total CHOL/HDL Ratio 4.1     VLDL 11  0 - 40 mg/dL   LDL Cholesterol 124 (*) 0 - 99 mg/dL  TSH      Result Value Ref Range   TSH 1.646  0.350 - 4.500 uIU/mL  IFOBT (OCCULT BLOOD)      Result Value Ref Range   IFOBT Negative    POCT CBC      Result Value Ref Range   WBC 5.2  4.6 - 10.2 K/uL   Lymph, poc 1.7  0.6 - 3.4   POC LYMPH PERCENT 32.5  10 - 50  %L   MID (cbc) 0.4  0 - 0.9   POC MID % 7.5  0 - 12 %M   POC Granulocyte 3.1  2 - 6.9   Granulocyte percent 60.0  37 - 80 %G   RBC 4.73  4.69 - 6.13 M/uL   Hemoglobin 14.4  14.1 - 18.1 g/dL   HCT, POC 45.7  43.5 - 53.7 %   MCV 96.7  80 - 97 fL   MCH, POC 30.4  27 - 31.2 pg   MCHC 31.5 (*) 31.8 - 35.4 g/dL   RDW, POC 13.2     Platelet Count, POC 225  142 - 424 K/uL   MPV 8.6  0 - 99.8 fL  POCT GLYCOSYLATED HEMOGLOBIN (HGB A1C)      Result Value Ref Range   Hemoglobin A1C 5.2       EKG/XRAY:   Primary read interpreted by Dr. Marin Comment at [facility].   ASSESSMENT/PLAN: Encounter Diagnoses  Name Primary?  . Otalgia of left ear Yes  . Otitis, externa, infective, left    Rx cipdrodex, if no improvement then will take azithromycin Avoid water F/u prn   Gross sideeffects, risk and benefits, and alternatives of medications d/w patient. Patient is aware that all medications have potential sideeffects and we are unable to predict every sideeffect or drug-drug interaction that may occur.  Cornelius Marullo, [place], DO 02/11/2014 5:33 PM

## 2014-02-11 NOTE — Patient Instructions (Signed)
Otitis Externa Otitis externa is a bacterial or fungal infection of the outer ear canal. This is the area from the eardrum to the outside of the ear. Otitis externa is sometimes called "swimmer's ear." CAUSES  Possible causes of infection include:  Swimming in dirty water.  Moisture remaining in the ear after swimming or bathing.  Mild injury (trauma) to the ear.  Objects stuck in the ear (foreign body).  Cuts or scrapes (abrasions) on the outside of the ear. SYMPTOMS  The first symptom of infection is often itching in the ear canal. Later signs and symptoms may include swelling and redness of the ear canal, ear pain, and yellowish-white fluid (pus) coming from the ear. The ear pain may be worse when pulling on the earlobe. DIAGNOSIS  Your caregiver will perform a physical exam. A sample of fluid may be taken from the ear and examined for bacteria or fungi. TREATMENT  Antibiotic ear drops are often given for 10 to 14 days. Treatment may also include pain medicine or corticosteroids to reduce itching and swelling. PREVENTION   Keep your ear dry. Use the corner of a towel to absorb water out of the ear canal after swimming or bathing.  Avoid scratching or putting objects inside your ear. This can damage the ear canal or remove the protective wax that lines the canal. This makes it easier for bacteria and fungi to grow.  Avoid swimming in lakes, polluted water, or poorly chlorinated pools.  You may use ear drops made of rubbing alcohol and vinegar after swimming. Combine equal parts of white vinegar and alcohol in a bottle. Put 3 or 4 drops into each ear after swimming. HOME CARE INSTRUCTIONS   Apply antibiotic ear drops to the ear canal as prescribed by your caregiver.  Only take over-the-counter or prescription medicines for pain, discomfort, or fever as directed by your caregiver.  If you have diabetes, follow any additional treatment instructions from your caregiver.  Keep all  follow-up appointments as directed by your caregiver. SEEK MEDICAL CARE IF:   You have a fever.  Your ear is still red, swollen, painful, or draining pus after 3 days.  Your redness, swelling, or pain gets worse.  You have a severe headache.  You have redness, swelling, pain, or tenderness in the area behind your ear. MAKE SURE YOU:   Understand these instructions.  Will watch your condition.  Will get help right away if you are not doing well or get worse. Document Released: 07/10/2005 Document Revised: 10/02/2011 Document Reviewed: 07/27/2011 [facility] Patient Information 2015 [place], [place]. This information is not intended to replace advice given to you by your health care provider. Make sure you discuss any questions you have with your health care provider.

## 2014-06-01 ENCOUNTER — Telehealth: Payer: Self-pay

## 2014-06-01 NOTE — Telephone Encounter (Signed)
Patient dropped off form to be signed by physician documenting when his last CPE was. Patient saw Dr Linna Darner for last CPE so this was placed in his box  Please call [phone] when ready for pick up

## 2014-06-02 NOTE — Telephone Encounter (Signed)
Left message on patient's VM that paper is ready

## 2014-07-27 ENCOUNTER — Ambulatory Visit (INDEPENDENT_AMBULATORY_CARE_PROVIDER_SITE_OTHER): Payer: 59 | Admitting: Emergency Medicine

## 2014-07-27 VITALS — BP 112/68 | HR 69 | Temp 97.9°F | Resp 18 | Ht 73.5 in | Wt 180.0 lb

## 2014-07-27 DIAGNOSIS — N486 Induration penis plastica: Secondary | ICD-10-CM

## 2014-07-27 NOTE — Progress Notes (Signed)
Subjective:  This chart was scribed for Chad Jordan, MD by Dellis Filbert, ED Scribe at [facility].The patient was seen in exam room 09 and the patient's care was started at 12:30 PM.   Patient ID: Chad Griffin, male    DOB: 1970-10-09, 44 y.o.   MRN: [id number] Chief Complaint  Patient presents with  . genital pain    x1 week    HPI  HPI Comments: Chad Griffin is a 44 y.o. male who presents to [facility] complaining of genital pain, onset week ago. He has penile swelling and pain at the tip of his penis. Pt has overall general soreness. He denies trauma or new sexual partners. Pt does regularly  swim at the [facility]. Hhe has does not have new swimming trunks. He has not done anything for relief.  Pt has been swimming for less than a year. He denies any abdominal pain. The shape of his penis has not changed during an erection since the onset of this complaint.  No FHx of GU cancers.    There are no active problems to display for this patient.  History reviewed. No pertinent past medical history. History reviewed. No pertinent past surgical history. Allergies  Allergen Reactions  . Penicillins Swelling   Prior to Admission medications   Medication Sig Start Date End Date Taking? Authorizing Provider  azithromycin (ZITHROMAX) 250 MG tablet Take 2 tabs po now then 1 tab po daily Patient not taking: Reported on 07/27/2014 02/11/14   Thao P Le, DO  ciprofloxacin-dexamethasone (CIPRODEX) otic suspension Place 4 drops into the left ear 2 (two) times daily. Patient not taking: Reported on 07/27/2014 02/11/14   Thao P Le, DO  fluticasone (FLONASE) 50 MCG/ACT nasal spray Place 2 sprays into the nose daily. Patient not taking: Reported on 07/27/2014 08/05/12   Areta Haber Dunn, PA-C  ipratropium (ATROVENT) 0.06 % nasal spray Place 2 sprays into the nose 3 (three) times daily. Patient not taking: Reported on 07/27/2014 08/05/12   Rise Mu, PA-C   History   Social History  . Marital Status:  Single    Spouse Name: N/A    Number of Children: N/A  . Years of Education: N/A   Occupational History  . Not on file.   Social History Main Topics  . Smoking status: Never Smoker   . Smokeless tobacco: Never Used  . Alcohol Use: No  . Drug Use: Not on file  . Sexual Activity: Not on file   Other Topics Concern  . Not on file   Social History Narrative   Review of Systems  Gastrointestinal: Negative for abdominal pain.  Genitourinary: Positive for penile swelling and penile pain.      Objective:  BP 112/68 mmHg  Pulse 69  Temp(Src) 97.9 F (36.6 C) (Oral)  Resp 18  Ht 6' 1.5" (1.867 m)  Wt 180 lb (81.647 kg)  BMI 23.42 kg/m2  SpO2 98%  Physical Exam  Constitutional: He is oriented to person, place, and time. He appears well-developed and well-nourished.  HENT:  Head: Normocephalic and atraumatic.  Eyes: EOM are normal.  Neck: Normal range of motion.  Cardiovascular: Normal rate.   Pulmonary/Chest: Effort normal.  Genitourinary:  A 1/2 by 2 cm firm area just beneath the skin. Just proximal of the glands of the penis. Which is very slightly tender and consistent a superficial vein with clot.  Musculoskeletal: Normal range of motion.  Neurological: He is alert and oriented to person,  place, and time.  Skin: Skin is warm and dry.  Psychiatric: He has a normal mood and affect. His behavior is normal.  Nursing note and vitals reviewed.       Assessment & Plan:   I suspect this area is a clotted superficial vein. I did give him information about Peyronie's disease. He will treat the area with heat a baby aspirin a day along with Aleve 1-2 twice a day.

## 2014-07-27 NOTE — Patient Instructions (Signed)
Take 1 baby aspirin a day. Take 1-2 Aleve twice a day. Apply warm heat to the area with a moist washcloth twice a day. Appointment with urology will be made.

## 2014-09-16 IMAGING — CR DG CHEST 2V
2 series · 2 of 2 positions shown · non-contrast
Comparison: None

CLINICAL DATA: Shortness of breath.

CHEST - 2 VIEW

[PA]
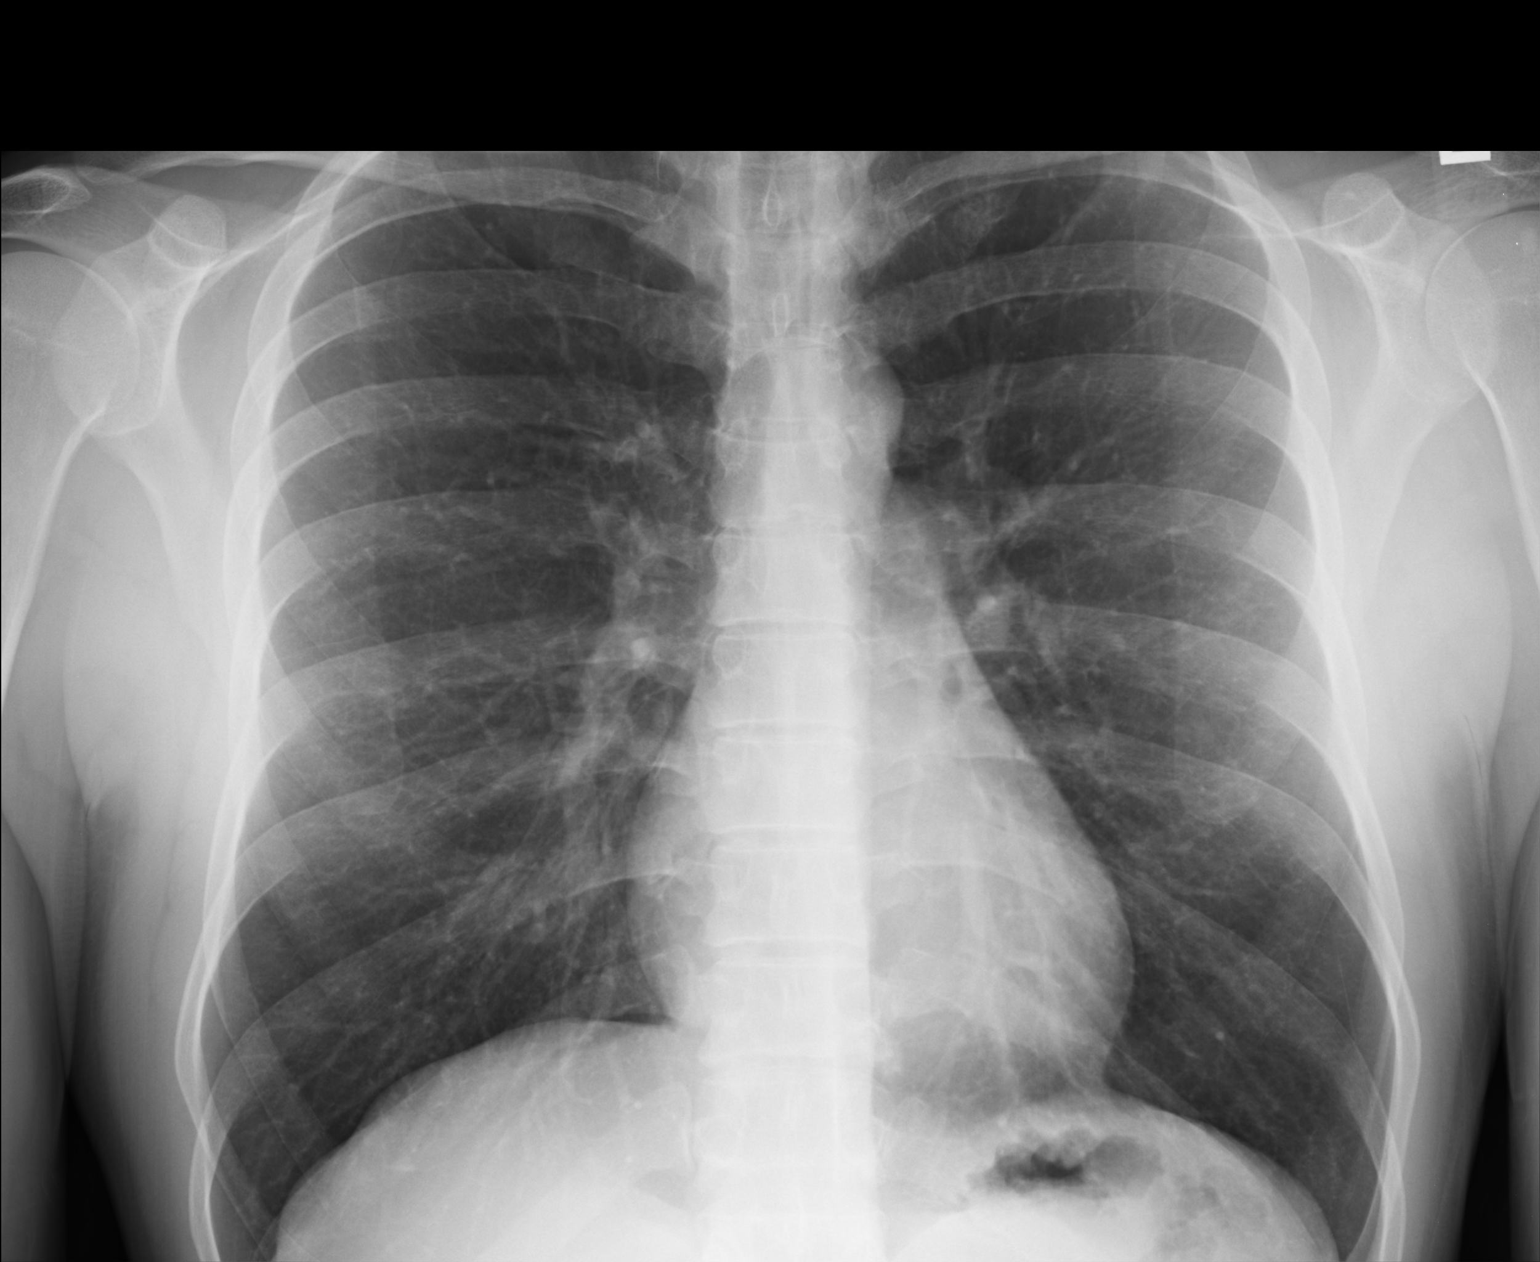

[lateral]
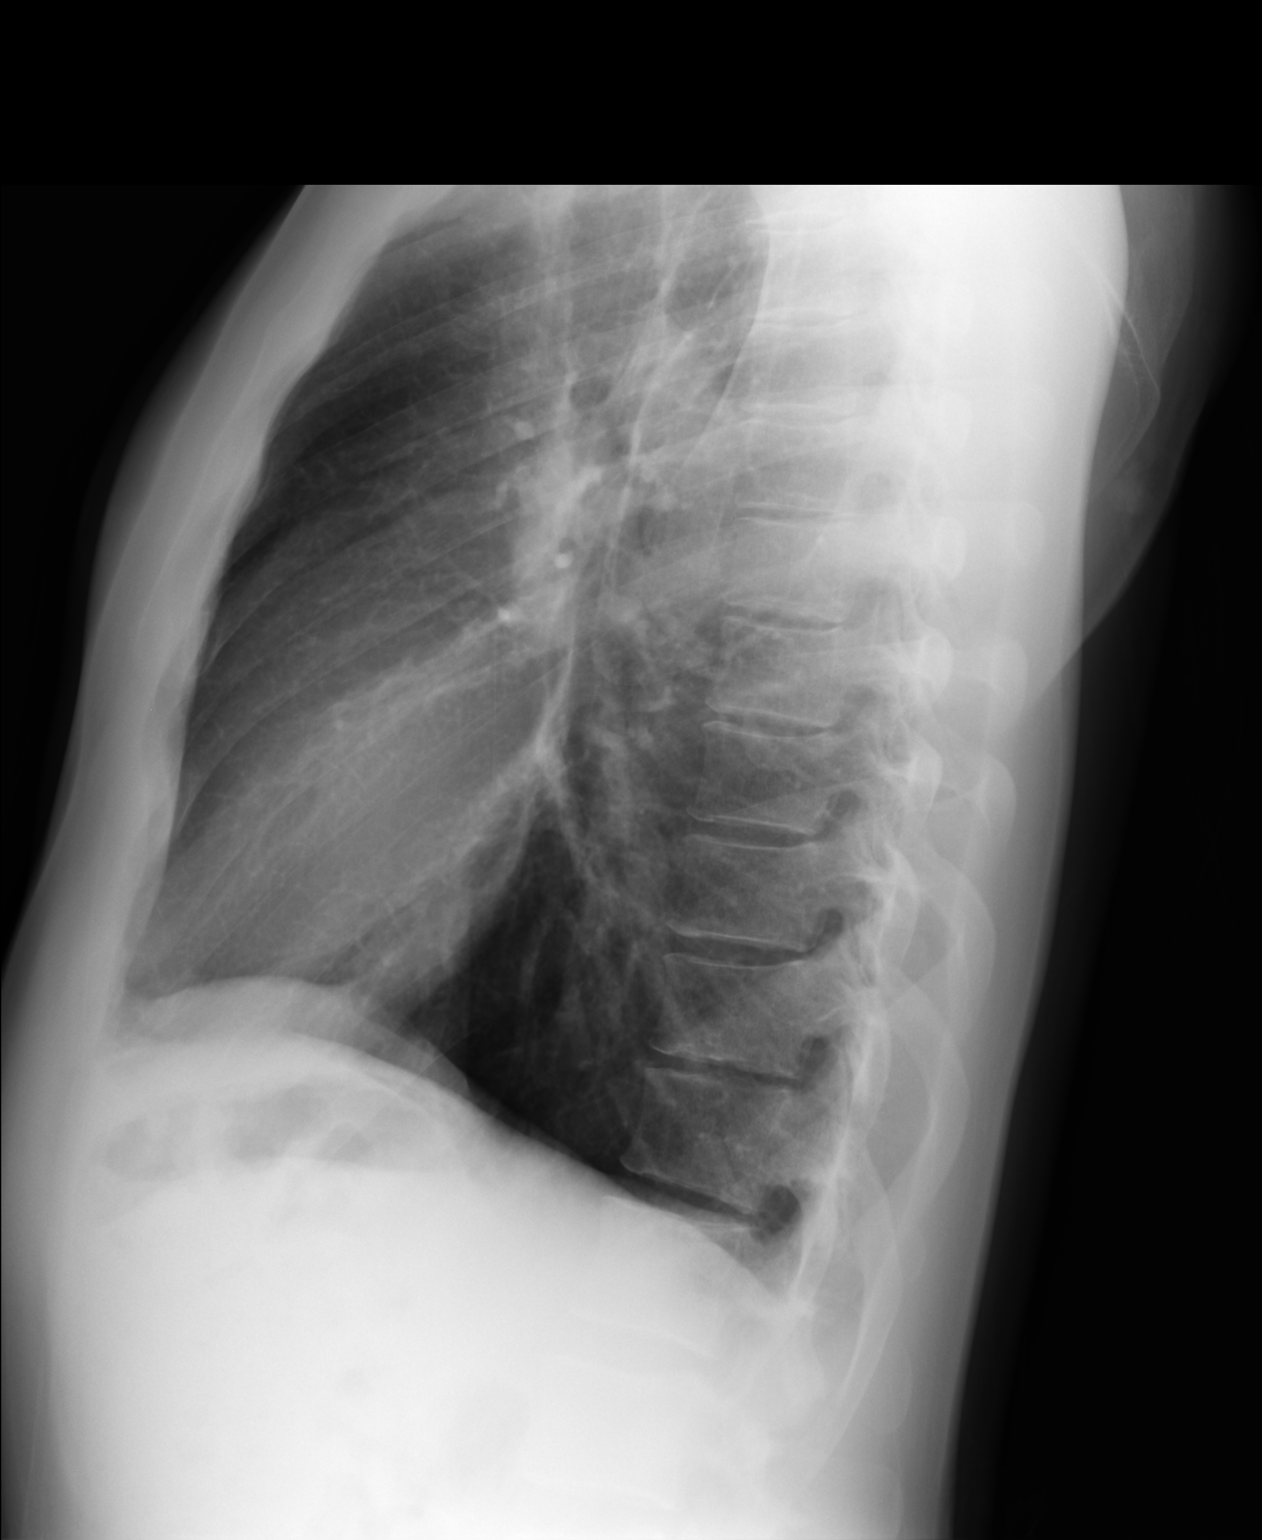

[2 of 2 positions shown; findings below may reference images not displayed]

FINDINGS: The heart size and mediastinal contours are within normal
limits.  Both lungs are clear.  The visualized skeletal structures
are unremarkable.
IMPRESSION: No active disease.

## 2014-09-16 IMAGING — CR DG ABDOMEN 1V
2 series · 2 of 2 positions shown · non-contrast
Comparison: 08/11/2011

CLINICAL DATA: Abdominal mass.

ABDOMEN - 1 VIEW

[AP (1 of 2)]
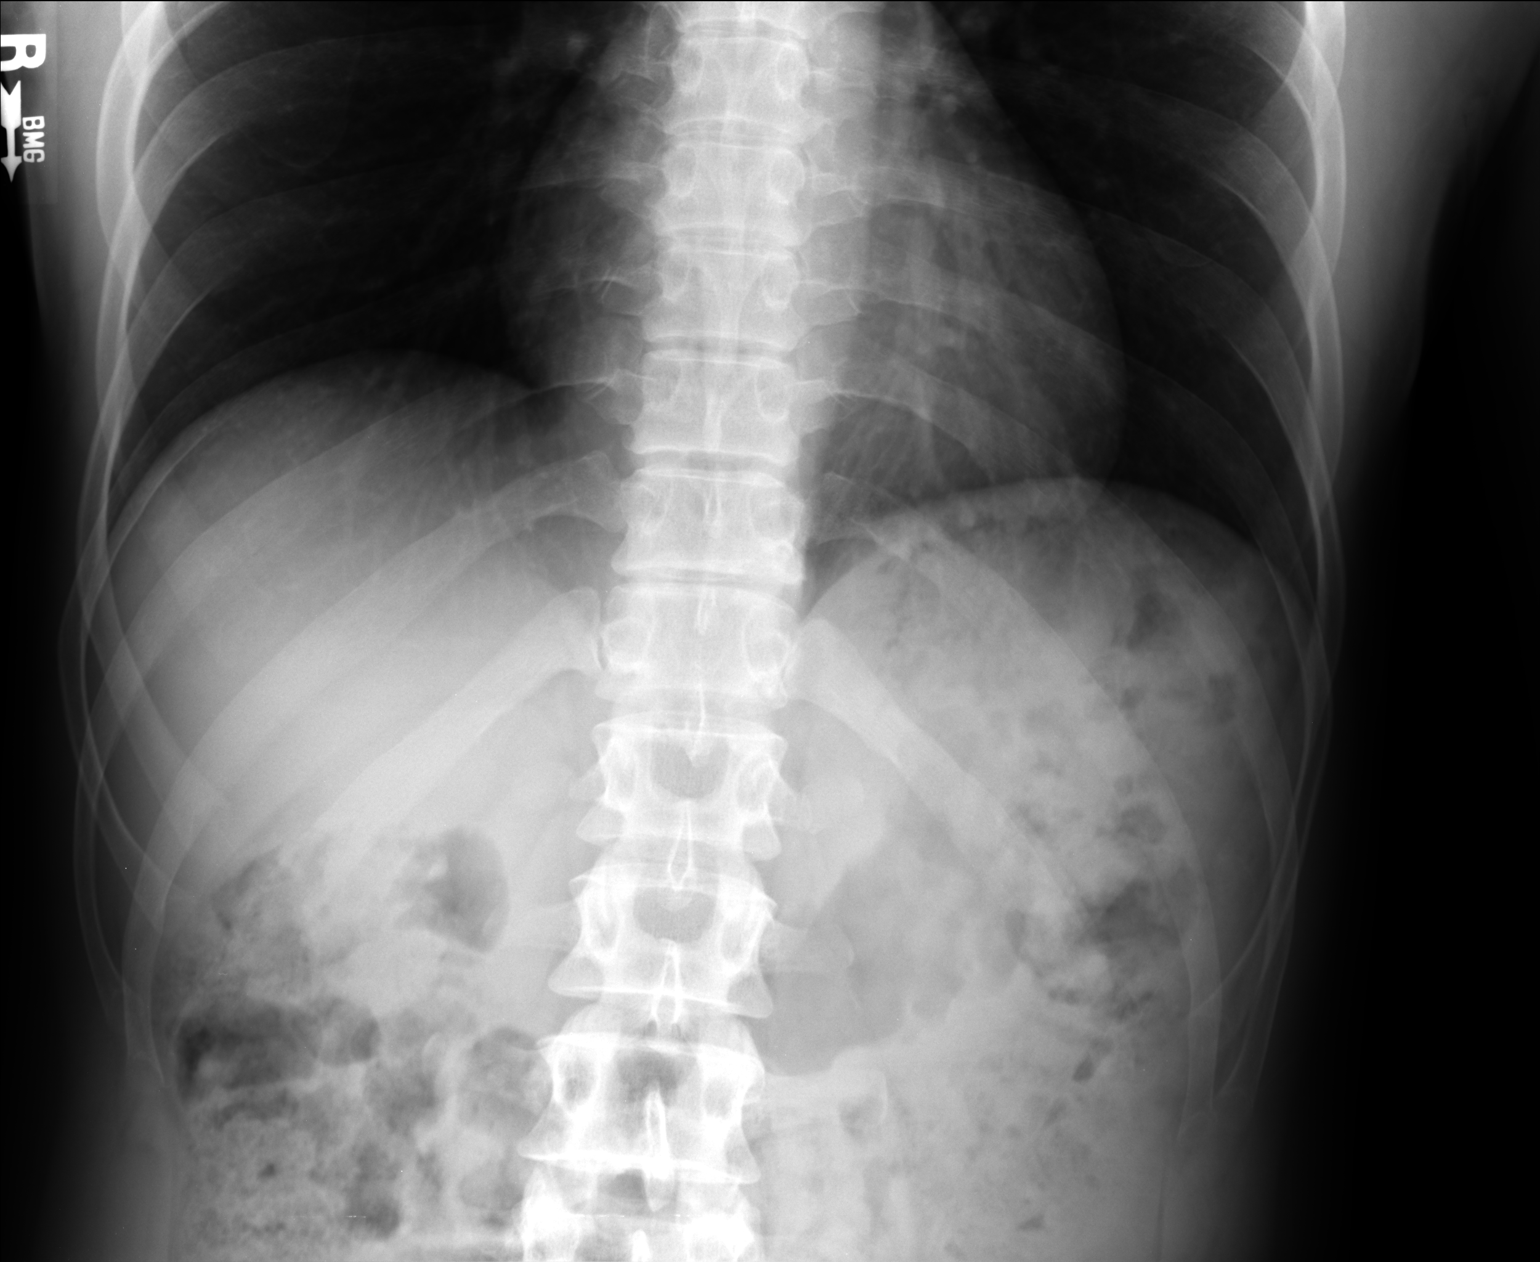

[AP (2 of 2)]
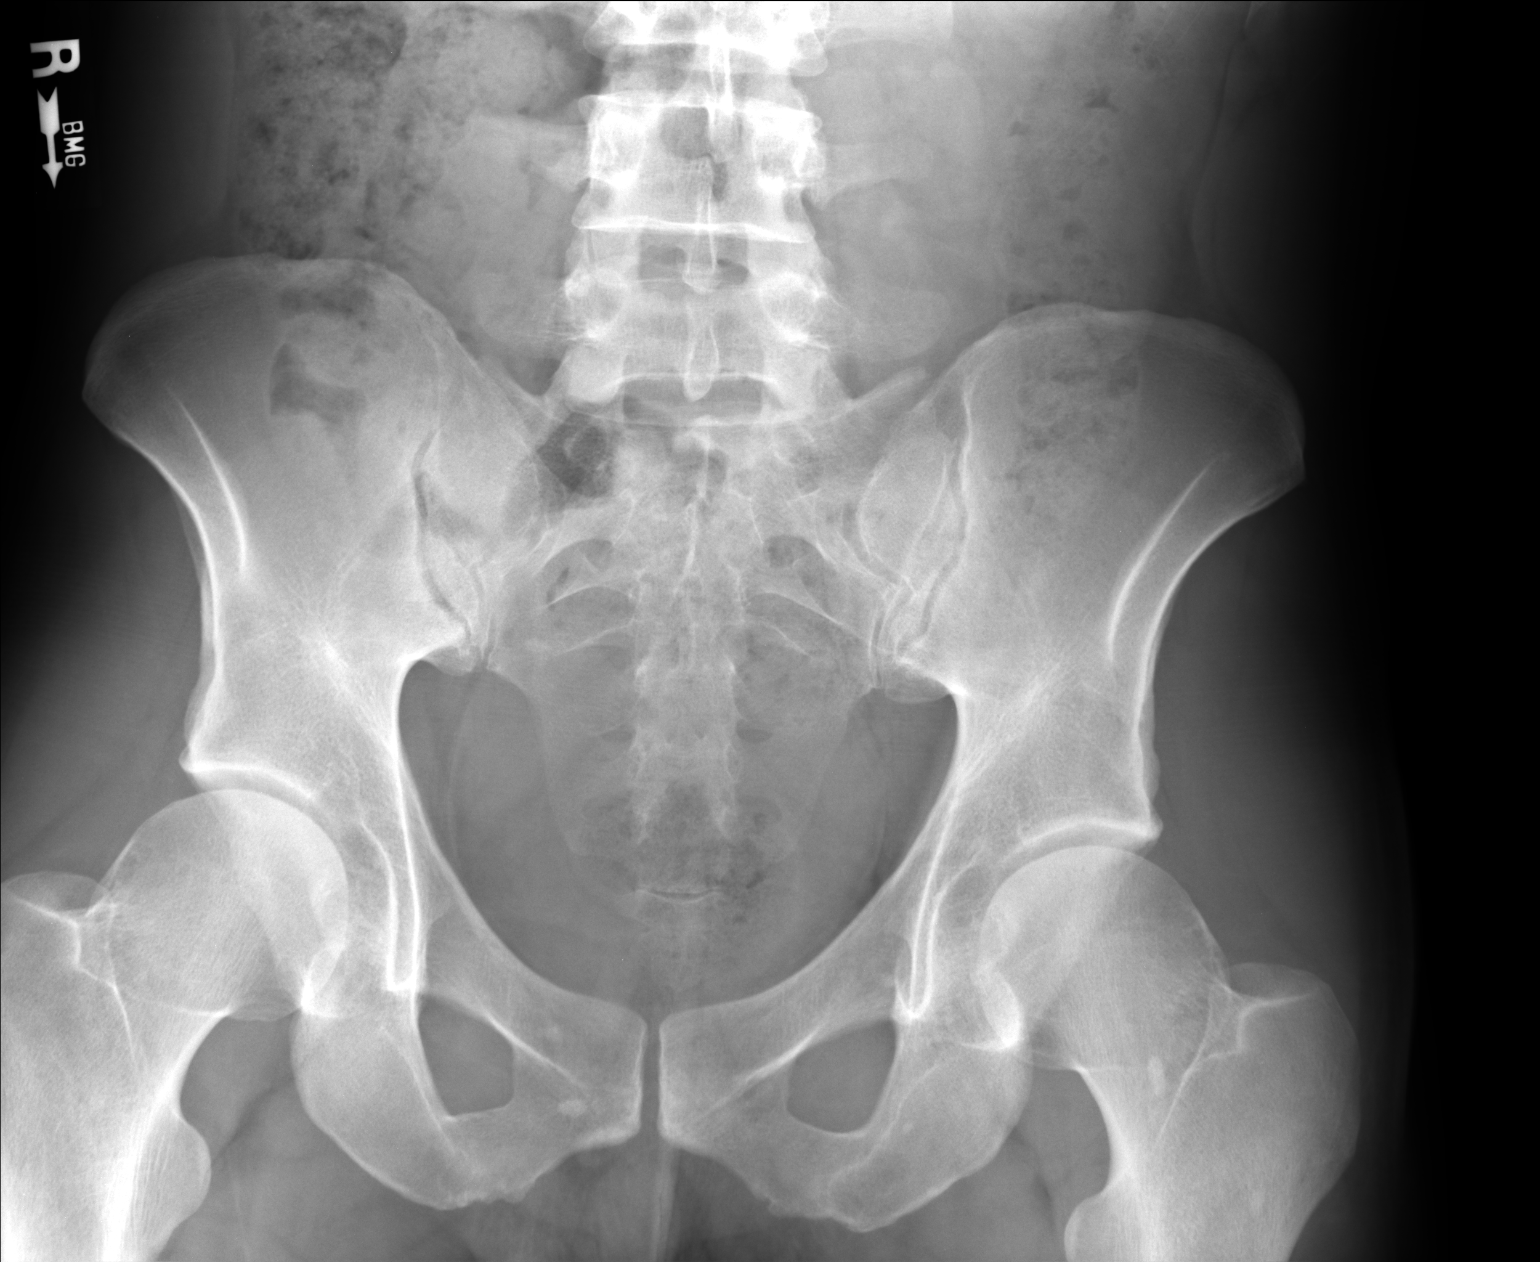

[2 of 2 positions shown; findings below may reference images not displayed]

FINDINGS: The bowel gas pattern is within normal limits.  No
evidence of bowel obstruction.  No soft tissue abnormalities or
obvious mass lesions.  No abnormal calcifications are visualized.
Bony structures are within normal limits.
IMPRESSION: Normal abdominal film.

## 2014-09-22 IMAGING — US US ABDOMEN COMPLETE
1 series · 14 of 25 positions shown · non-contrast
Comparison: None.

CLINICAL DATA: Left upper quadrant mass

ABDOMINAL ULTRASOUND COMPLETE

[Series 1: us abdomen complete · 0.22mm/px · 14 of 79 slices shown]
[im 1/79]
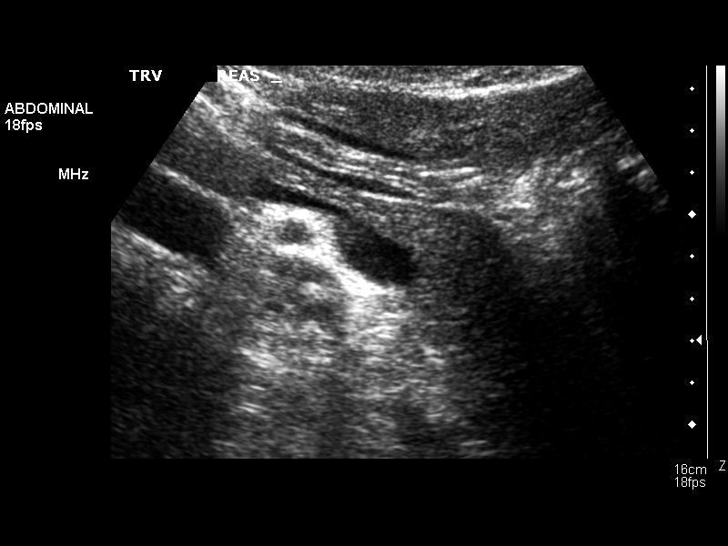
[im 7/79]
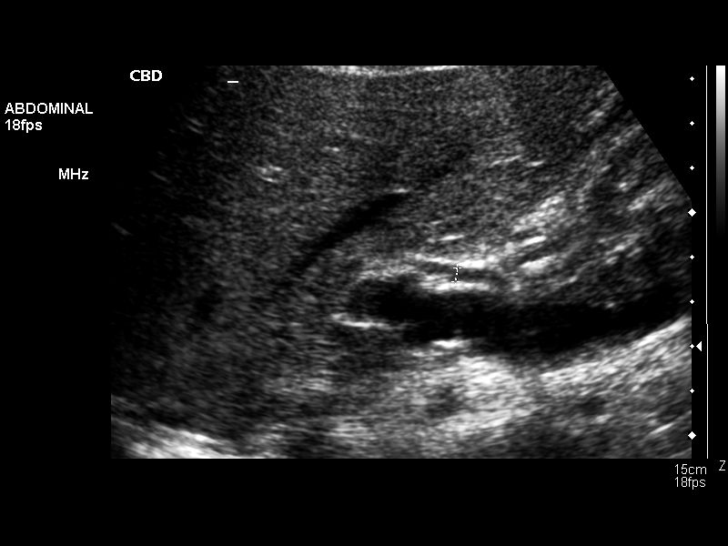
[im 14/79]
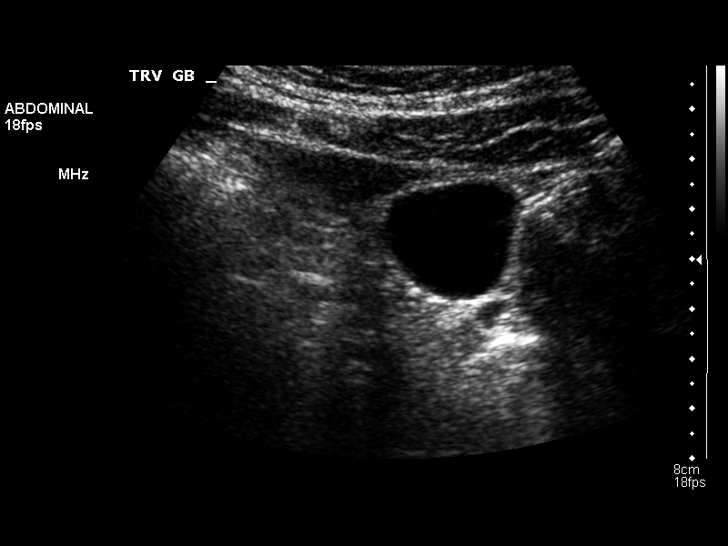
[im 20/79]
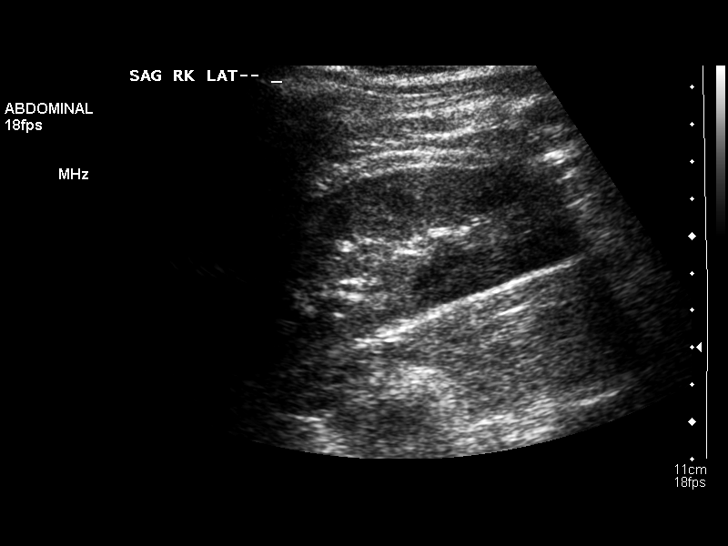
[im 27/79]
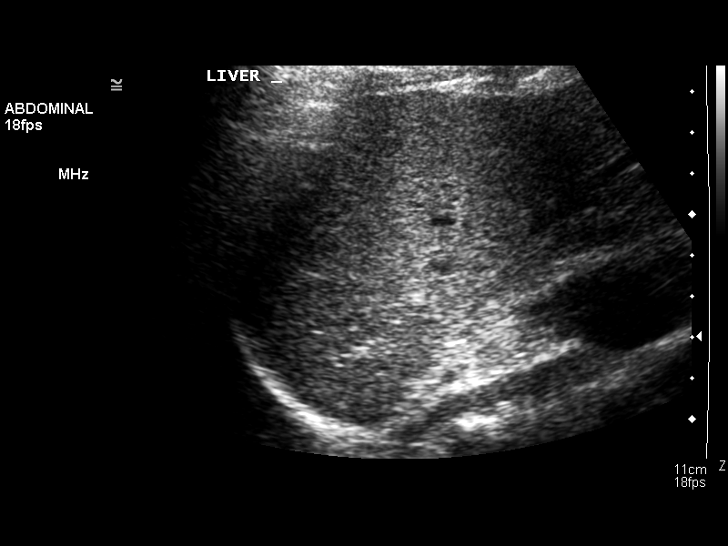
[im 30/79]
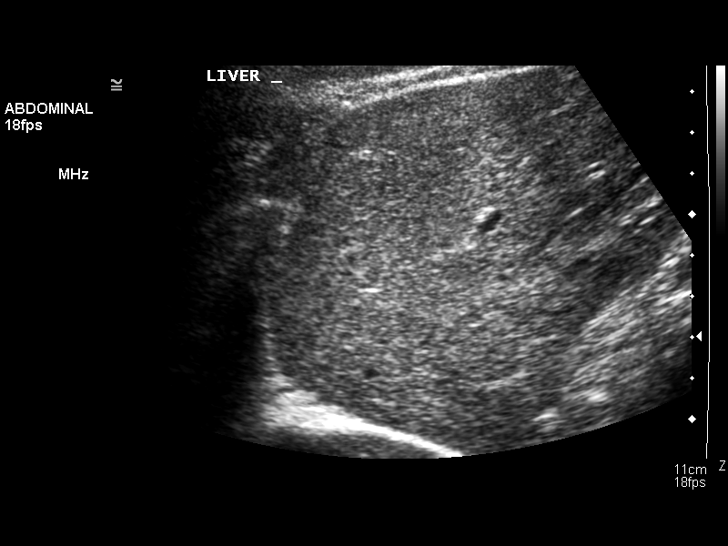
[im 36/79]
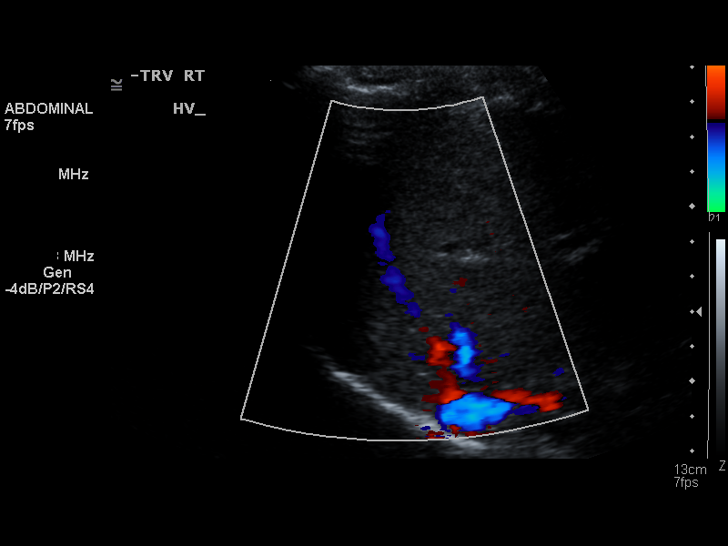
[im 43/79]
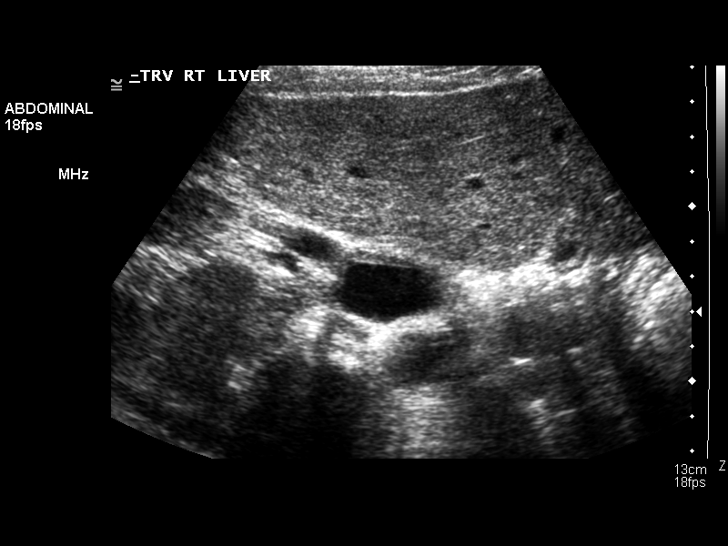
[im 49/79]
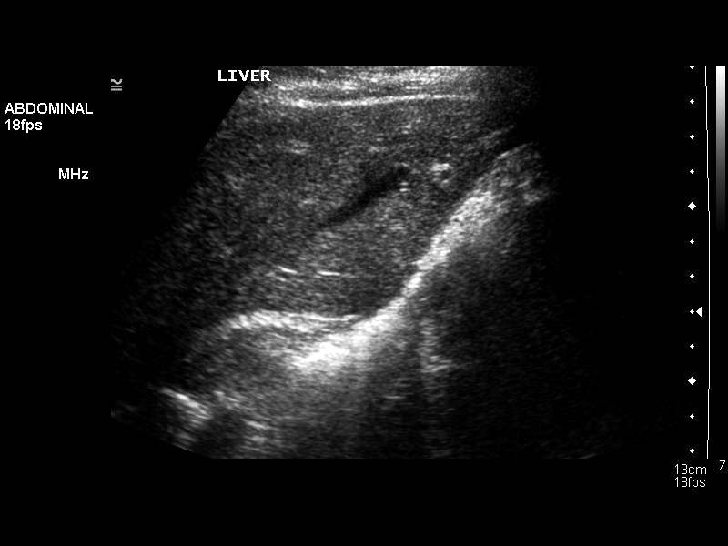
[im 53/79]
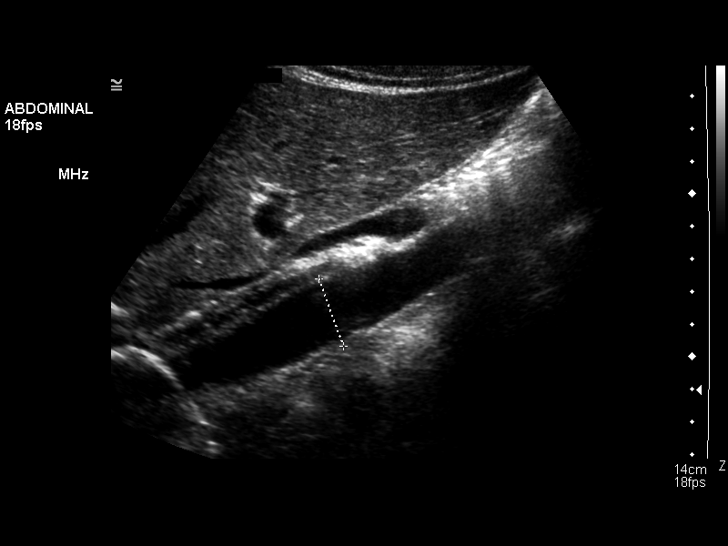
[im 59/79]
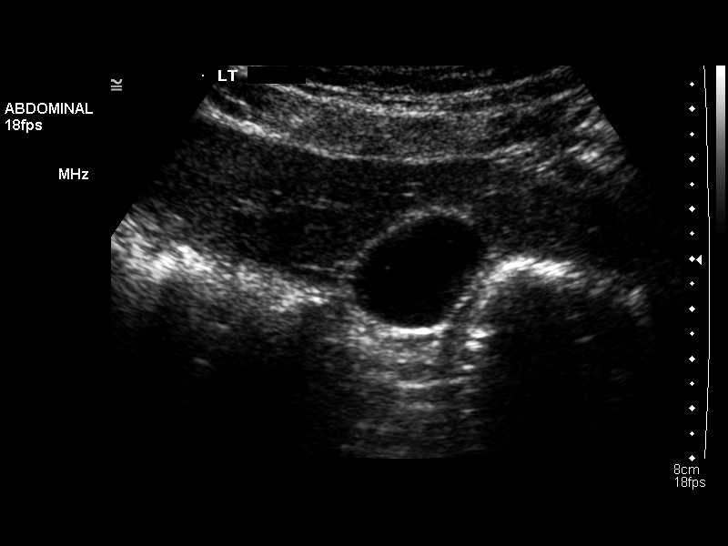
[im 66/79]
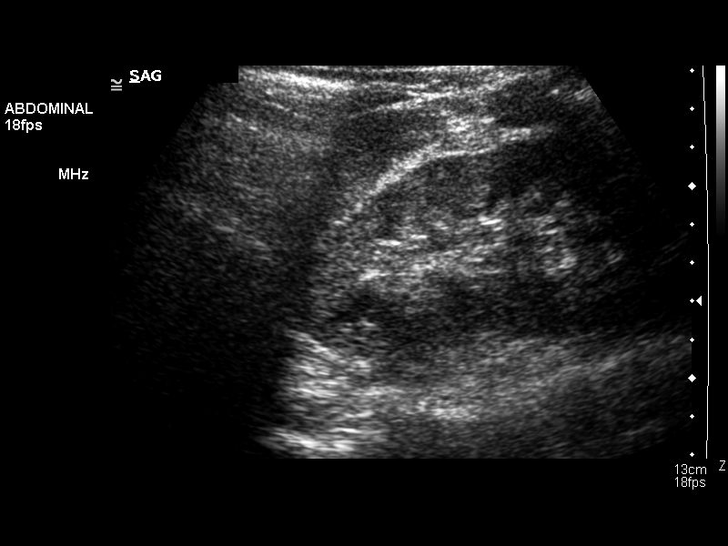
[im 72/79]
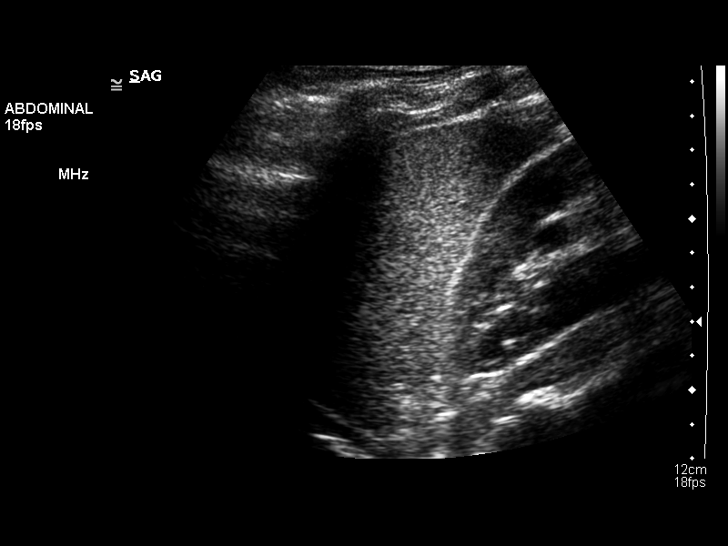
[im 79/79]
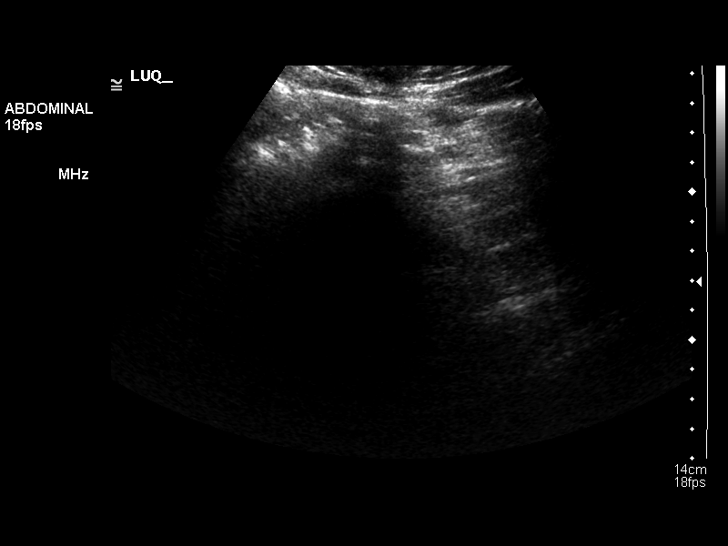

[14 of 25 positions shown; findings below may reference images not displayed]

FINDINGS: Gallbladder:  No gallstones, gallbladder wall thickening, or
pericholecystic fluid.

Common Bile Duct:  Within normal limits in caliber.

Liver: No focal mass lesion identified.  Within normal limits in
parenchymal echogenicity.

IVC:  Appears normal.

Pancreas:  No abnormality identified.

Spleen:  Within normal limits in size and echotexture.

Right kidney:  Normal in size and parenchymal echogenicity.  No
evidence of mass or hydronephrosis.

Left kidney:  Normal in size and parenchymal echogenicity.  No
evidence of mass or hydronephrosis.

Abdominal Aorta:  No aneurysm identified.
IMPRESSION: Negative abdominal ultrasound.

## 2014-10-14 ENCOUNTER — Ambulatory Visit (INDEPENDENT_AMBULATORY_CARE_PROVIDER_SITE_OTHER): Payer: 59 | Admitting: Internal Medicine

## 2014-10-14 ENCOUNTER — Encounter: Payer: Self-pay | Admitting: Internal Medicine

## 2014-10-14 VITALS — BP 114/74 | HR 78 | Temp 98.0°F | Resp 16 | Ht 73.25 in | Wt 181.2 lb

## 2014-10-14 DIAGNOSIS — Z0000 Encounter for general adult medical examination without abnormal findings: Secondary | ICD-10-CM

## 2014-10-14 LAB — COMPLETE METABOLIC PANEL WITH GFR
ALBUMIN: 4.5 g/dL (ref 3.5–5.2)
ALT: 12 U/L (ref 0–53)
AST: 17 U/L (ref 0–37)
Alkaline Phosphatase: 49 U/L (ref 39–117)
BUN: 8 mg/dL (ref 6–23)
CHLORIDE: 100 meq/L (ref 96–112)
CO2: 28 mEq/L (ref 19–32)
CREATININE: 0.9 mg/dL (ref 0.50–1.35)
Calcium: 9.5 mg/dL (ref 8.4–10.5)
GFR, Est Non African American: >89 mL/min
Glucose, Bld: 83 mg/dL (ref 70–99)
POTASSIUM: 4.2 meq/L (ref 3.5–5.3)
Sodium: 137 mEq/L (ref 135–145)
TOTAL PROTEIN: 7.2 g/dL (ref 6.0–8.3)
Total Bilirubin: 1.1 mg/dL (ref 0.2–1.2)

## 2014-10-14 LAB — LIPID PANEL
Cholesterol: 159 mg/dL (ref 0–200)
HDL: 50 mg/dL (ref >=40)
LDL Cholesterol: 96 mg/dL (ref 0–99)
Total CHOL/HDL Ratio: 3.2 Ratio
Triglycerides: 66 mg/dL (ref <150)
VLDL: 13 mg/dL (ref 0–40)

## 2014-10-14 LAB — POCT URINALYSIS DIPSTICK
BILIRUBIN UA: NEGATIVE
Glucose, UA: NEGATIVE
Ketones, UA: NEGATIVE
Leukocytes, UA: NEGATIVE
Nitrite, UA: NEGATIVE
PROTEIN UA: NEGATIVE
RBC UA: NEGATIVE
Spec Grav, UA: 1.01
UROBILINOGEN UA: 0.2
pH, UA: 7.5

## 2014-10-14 LAB — CBC WITH DIFFERENTIAL/PLATELET
BASOS PCT: 1 % (ref 0–1)
Basophils Absolute: 0 10*3/uL (ref 0.0–0.1)
Eosinophils Absolute: 0.1 10*3/uL (ref 0.0–0.7)
Eosinophils Relative: 2 % (ref 0–5)
HCT: 41.9 % (ref 39.0–52.0)
HEMOGLOBIN: 14 g/dL (ref 13.0–17.0)
LYMPHS PCT: 30 % (ref 12–46)
Lymphs Abs: 1.3 10*3/uL (ref 0.7–4.0)
MCH: 31 pg (ref 26.0–34.0)
MCHC: 33.4 g/dL (ref 30.0–36.0)
MCV: 92.7 fL (ref 78.0–100.0)
MONO ABS: 0.6 10*3/uL (ref 0.1–1.0)
MPV: 10 fL (ref 8.6–12.4)
Monocytes Relative: 13 % — ABNORMAL HIGH (ref 3–12)
NEUTROS ABS: 2.3 10*3/uL (ref 1.7–7.7)
NEUTROS PCT: 54 % (ref 43–77)
PLATELETS: 212 10*3/uL (ref 150–400)
RBC: 4.52 MIL/uL (ref 4.22–5.81)
RDW: 13.7 % (ref 11.5–15.5)
WBC: 4.3 10*3/uL (ref 4.0–10.5)

## 2014-10-14 NOTE — Progress Notes (Signed)
   Subjective:    Patient ID: Chad Griffin, male    DOB: March 05, 1971, 44 y.o.   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 Review of Systems 14pt form neg    Objective:   Physical Exam  Constitutional: He is oriented to person, place, and time. He appears well-developed and well-nourished.  HENT:  Head: Normocephalic and atraumatic.  Right Ear: Hearing, tympanic membrane, external ear and ear canal normal.  Left Ear: Hearing, tympanic membrane, external ear and ear canal normal.  Nose: Nose normal.  Mouth/Throat: Uvula is midline, oropharynx is clear and moist and mucous membranes are normal.  Eyes: Conjunctivae, EOM and lids are normal. Pupils are equal, round, and reactive to light. Right eye exhibits no discharge. Left eye exhibits no discharge. No scleral icterus.  Neck: Trachea normal and normal range of motion. Neck supple. Carotid bruit is not present.  Cardiovascular: Normal rate, regular rhythm, normal heart sounds, intact distal pulses and normal pulses.   No murmur heard. Pulmonary/Chest: Effort normal and breath sounds normal. No respiratory distress. He has no wheezes. He has no rhonchi. He has no rales.  Abdominal: Soft. Normal appearance and bowel sounds are normal. He exhibits no abdominal bruit. There is no tenderness.  Musculoskeletal: Normal range of motion. He exhibits no edema or tenderness.  Lymphadenopathy:       Head (right side): No submental, no submandibular, no tonsillar, no preauricular, no posterior auricular and no occipital adenopathy present.       Head (left side): No submental, no submandibular, no tonsillar, no preauricular, no posterior auricular and no occipital adenopathy present.    He has no cervical  adenopathy.  Neurological: He is alert and oriented to person, place, and time. He has normal strength and normal reflexes. No cranial nerve deficit or sensory deficit. Coordination and gait normal.  Skin: Skin is warm, dry and intact. No lesion and no rash noted.  His crown is without hair and there are 2 scabbed lesions less than 0.5 cm without clearly demarcated borders that are rolled or raised and with no hyperpigmentation.  Psychiatric: He has a normal mood and affect. His speech is normal and behavior is normal. Judgment and thought content normal.  BP 114/74 mmHg  Pulse 78  Temp(Src) 98 F (36.7 C) (Oral)  Resp 16  Ht 6' 1.25" (1.861 m)  Wt 181 lb 3.2 oz (82.192 kg)  BMI 23.73 kg/m2  SpO2 100%     Assessment & Plan:  Annual exam-healthy These 2 skin lesions should resolve the scabbing over the next 2-3 weeks or he needs dermatological evaluation to rule out actinic keratosis needing treatment or even early basal cell.  Routine labs Tetanus up-to-date

## 2014-10-14 NOTE — Progress Notes (Signed)
   Subjective:    Patient ID: Chad Griffin, male    DOB: 12/05/1970, 44 y.o.   MRN: [id number]  HPI    Review of Systems  Constitutional: Negative.   HENT: Negative.   Eyes: Negative.   Respiratory: Negative.   Cardiovascular: Negative.   Gastrointestinal: Negative.   Endocrine: Negative.   Genitourinary: Negative.   Musculoskeletal: Negative.   Skin: Negative.   Allergic/Immunologic: Negative.   Neurological: Negative.   Hematological: Negative.   Psychiatric/Behavioral: Negative.        Objective:   Physical Exam        Assessment & Plan:

## 2014-10-18 ENCOUNTER — Encounter: Payer: Self-pay | Admitting: Internal Medicine

## 2014-11-13 ENCOUNTER — Ambulatory Visit (INDEPENDENT_AMBULATORY_CARE_PROVIDER_SITE_OTHER): Payer: 59 | Admitting: Physician Assistant

## 2014-11-13 VITALS — BP 108/68 | HR 70 | Temp 97.8°F | Resp 16 | Ht 73.5 in | Wt 176.0 lb

## 2014-11-13 DIAGNOSIS — J020 Streptococcal pharyngitis: Secondary | ICD-10-CM | POA: Diagnosis not present

## 2014-11-13 DIAGNOSIS — Z638 Other specified problems related to primary support group: Secondary | ICD-10-CM

## 2014-11-13 DIAGNOSIS — F439 Reaction to severe stress, unspecified: Secondary | ICD-10-CM

## 2014-11-13 LAB — POCT CBC
Granulocyte percent: 67.8 %G (ref 37–80)
HCT, POC: 40.6 % — AB (ref 43.5–53.7)
Hemoglobin: 13.4 g/dL — AB (ref 14.1–18.1)
LYMPH, POC: 1.4 (ref 0.6–3.4)
MCH: 29.8 pg (ref 27–31.2)
MCHC: 32.9 g/dL (ref 31.8–35.4)
MCV: 90.4 fL (ref 80–97)
MID (CBC): 0.4 (ref 0–0.9)
MPV: 6.9 fL (ref 0–99.8)
PLATELET COUNT, POC: 210 10*3/uL (ref 142–424)
POC GRANULOCYTE: 3.8 (ref 2–6.9)
POC LYMPH %: 24.3 % (ref 10–50)
POC MID %: 7.9 % (ref 0–12)
RBC: 4.49 M/uL — AB (ref 4.69–6.13)
RDW, POC: 13.2 %
WBC: 5.6 10*3/uL (ref 4.6–10.2)

## 2014-11-13 LAB — POCT RAPID STREP A (OFFICE): Rapid Strep A Screen: POSITIVE — AB

## 2014-11-13 MED ORDER — AZITHROMYCIN 250 MG PO TABS: ORAL_TABLET | ORAL | Status: DC

## 2014-11-13 NOTE — Progress Notes (Signed)
11/13/2014 at 9:27 AM  Corinna Gab / DOB: 1971/07/06 / MRN: [id number]  The patient  does not have a problem list on file.  SUBJECTIVE  Chief complaint: Edema and Sore Throat   Sore Throat: Patient complains of sore throat. Associated symptoms include coryza, itching in eyes, post nasal drip, sinus and nasal congestion and sore throat.Onset of symptoms was 5 days ago, gradually worsening since that time. He is drinking plenty of fluids. He does not have had recent close exposure to someone with proven streptococcal pharyngitis. He denies fever, belly pain, nausea/emesis, weakness and presyncope.   He endorses feeling depressed and anhedonic as of late.  His son is in the hospital for mental health issues, and shows no sign of improvement after 10 days of inpatient, and his partner also has mental health problems. He is sleeping well, denies psychomotor slowing, SI/HI, changes in concentration, and does not feel hopelessness or guilt.  He has a Social worker that he can access to speak with.    He  has no past medical history on file.    Medications reviewed and updated by myself where necessary, and exist elsewhere in the encounter.   Mr. Steig is allergic to penicillins. He  reports that he has never smoked. He has never used smokeless tobacco. He reports that he drinks alcohol. He reports that he does not use illicit drugs. He  has no sexual activity history on file. The patient  has no past surgical history on file.  His family history includes Diabetes in his maternal grandmother; Heart disease in his maternal grandfather.  Review of Systems  Constitutional: Negative for fever and chills.  HENT: Positive for sore throat.   Eyes: Negative.   Respiratory: Negative for cough.   Cardiovascular: Negative for chest pain.  Gastrointestinal: Negative for nausea and vomiting.  Genitourinary: Negative.   Musculoskeletal: Negative for myalgias.  Skin: Negative.   Neurological: Negative for  headaches.    OBJECTIVE  His  height is 6' 1.5" (1.867 m) and weight is 176 lb (79.833 kg). His oral temperature is 97.8 F (36.6 C). His blood pressure is 108/68 and his pulse is 70. His respiration is 16 and oxygen saturation is 100%.  The patient's body mass index is 22.9 kg/(m^2).  Physical Exam  Constitutional: He is oriented to person, place, and time. He appears well-developed and well-nourished. No distress.  HENT:  Right Ear: Hearing, tympanic membrane, external ear and ear canal normal.  Left Ear: Hearing, tympanic membrane, external ear and ear canal normal.  Nose: Mucosal edema present. No sinus tenderness. Right sinus exhibits no maxillary sinus tenderness and no frontal sinus tenderness. Left sinus exhibits no maxillary sinus tenderness and no frontal sinus tenderness.  Mouth/Throat: Uvula is midline and mucous membranes are normal. Mucous membranes are not pale, not dry and not cyanotic. Posterior oropharyngeal erythema present. No oropharyngeal exudate, posterior oropharyngeal edema or tonsillar abscesses.  Cardiovascular: Normal rate and regular rhythm.   Respiratory: Effort normal and breath sounds normal. He has no wheezes. He has no rales.  Musculoskeletal: Normal range of motion.  Lymphadenopathy:    He has no cervical adenopathy.  Neurological: He is alert and oriented to person, place, and time.  Skin: Skin is warm and dry. He is not diaphoretic.  Psychiatric: He has a normal mood and affect.    Results for orders placed or performed in visit on 11/13/14 (from the past 24 hour(s))  POCT rapid strep A     Status:  Abnormal   Collection Time: 11/13/14  9:09 AM  Result Value Ref Range   Rapid Strep A Screen Positive (A) Negative  POCT CBC     Status: Abnormal   Collection Time: 11/13/14  9:09 AM  Result Value Ref Range   WBC 5.6 4.6 - 10.2 K/uL   Lymph, poc 1.4 0.6 - 3.4   POC LYMPH PERCENT 24.3 10 - 50 %L   MID (cbc) 0.4 0 - 0.9   POC MID % 7.9 0 - 12 %M    POC Granulocyte 3.8 2 - 6.9   Granulocyte percent 67.8 37 - 80 %G   RBC 4.49 (A) 4.69 - 6.13 M/uL   Hemoglobin 13.4 (A) 14.1 - 18.1 g/dL   HCT, POC 40.6 (A) 43.5 - 53.7 %   MCV 90.4 80 - 97 fL   MCH, POC 29.8 27 - 31.2 pg   MCHC 32.9 31.8 - 35.4 g/dL   RDW, POC 13.2 %   Platelet Count, POC 210 142 - 424 K/uL   MPV 6.9 0 - 99.8 fL    ASSESSMENT & PLAN  Wilberto was seen today for edema and sore throat.  Diagnoses and all orders for this visit:  Streptococcal sore throat: Advised he continue taking ibuprofen 600 mg tid.  He is allergic to penicillins, thus macrolide therapy indicated.   Orders: -     POCT rapid strep A -     Cancel: Culture, Group A Strep -     POCT CBC -     azithromycin (ZITHROMAX) 250 MG tablet; Take 2 tabs PO x 1 dose, then 1 tab PO QD x 4 days  Stress at home: Advised patient seek the care of his counselor.  Advised that should he need help in the future for this problem to call or return to clinic to discuss increased physical activity and medication options.      The patient was advised to call or come back to clinic if he does not see an improvement in symptoms, or worsens with the above plan.   Philis Fendt, MHS, PA-C [facility] 11/13/2014 9:27 AM

## 2015-06-21 ENCOUNTER — Encounter: Payer: Self-pay | Admitting: Internal Medicine

## 2015-08-13 ENCOUNTER — Ambulatory Visit (INDEPENDENT_AMBULATORY_CARE_PROVIDER_SITE_OTHER): Payer: BLUE CROSS/BLUE SHIELD | Admitting: Family Medicine

## 2015-08-13 VITALS — BP 102/70 | HR 78 | Temp 98.4°F | Resp 16 | Ht 73.5 in | Wt 183.2 lb

## 2015-08-13 DIAGNOSIS — D224 Melanocytic nevi of scalp and neck: Secondary | ICD-10-CM | POA: Diagnosis not present

## 2015-08-13 DIAGNOSIS — Z23 Encounter for immunization: Secondary | ICD-10-CM | POA: Diagnosis not present

## 2015-08-13 NOTE — Patient Instructions (Signed)
I put in a referral for dermatology and asked that they try to schedule that before February 1. Have a great trip.    Moles Moles are usually harmless growths on the skin. They are accumulations of color (pigment) cells in the skin that:   Can be various colors, from light brown to black.  Can appear anywhere on the body.  May remain flat or become raised.  May contain hairs.  May remain smooth or develop wrinkling. Most moles are not cancerous (benign). However, some moles may develop changes and become cancerous. It is important to check your moles every month. If you check your moles regularly, you will be able to notice any changes that may occur.  CAUSES  Moles occur when skin cells grow together in clusters instead of spreading out in the skin as they normally do. The reason for this clustering is unknown. DIAGNOSIS  Your caregiver will perform a skin examination to diagnose your mole.  TREATMENT  Moles usually do not require treatment. If a mole becomes worrisome, your caregiver may choose to take a sample of the mole or remove it entirely, and then send it to a lab for examination.  HOME CARE INSTRUCTIONS  Check your mole(s) monthly for changes that may indicate skin cancer. These changes can include:  A change in size.  A change in color. Note that moles tend to darken during pregnancy or when taking birth control pills (oral contraception).  A change in shape.  A change in the border of the mole.  Wear sunscreen (with an SPF of at least 8) when you spend long periods of time outside. Reapply the sunscreen every 2-3 hours.  Schedule annual appointments with your skin doctor (dermatologist) if you have a large number of moles. SEEK MEDICAL CARE IF:  Your mole changes size, especially if it becomes larger than a pencil eraser.  Your mole changes in color or develops more than one color.  Your mole becomes itchy or bleeds.  Your mole, or the skin near the mole,  becomes painful, sore, red, or swollen.  Your mole becomes scaly, sheds skin, or oozes fluid.  Your mole develops irregular borders.  Your mole becomes flat or develops raised areas.  Your mole becomes hard or soft.   This information is not intended to replace advice given to you by your health care provider. Make sure you discuss any questions you have with your health care provider.   Document Released: 04/04/2001 Document Revised: 04/03/2012 Document Reviewed: 01/22/2012 Elsevier Interactive Patient Education [employer].

## 2015-08-13 NOTE — Progress Notes (Signed)
Subjective:  By signing my name below, I, Chad Griffin, attest that this documentation has been prepared under the direction and in the presence of Chad Ray, MD.  Electronically Signed: Thea Griffin, ED Scribe. 08/13/2015. 8:17 AM.  Patient ID: Chad Griffin, male    DOB: 09-06-1970, 45 y.o.   MRN: [id number]  HPI   Chief Complaint  Patient presents with  . other    pt has bumps on his head he would like to get checked out. Dry skin, no itching/ x 6 months.   HPI Comments: Chad Griffin is a 44 y.o. male who presents to the [facility] complaining of a bumps on scalp. Pt states he has had skin issues/bumps in the past which were removed with liquid nitrogen. He is unable to see area but can feel dryness and bumps. He rarely wears sun block on scalp and mostly wears hats.  He has not seen dermatology in the past. He mentions his father having skin issues as well but is unsure of family hx of skin cancer. Pt is leading a study abroad program and is traveling to [place] in the next 2 weeks.   Immunization Immunization History  Administered Date(s) Administered  . Tdap 04/22/2010  Pt agrees to flu shot today.   There are no active problems to display for this patient.  History reviewed. No pertinent past medical history. History reviewed. No pertinent past surgical history. Allergies  Allergen Reactions  . Penicillins Swelling   Prior to Admission medications   Medication Sig Start Date End Date Taking? Authorizing Provider  azithromycin (ZITHROMAX) 250 MG tablet Take 2 tabs PO x 1 dose, then 1 tab PO QD x 4 days Patient not taking: Reported on 08/13/2015 11/13/14   Tereasa Coop, PA-C   Social History   Social History  . Marital Status: Single    Spouse Name: N/A  . Number of Children: N/A  . Years of Education: N/A   Occupational History  . Not on file.   Social History Main Topics  . Smoking status: Never Smoker   . Smokeless tobacco: Never  Used  . Alcohol Use: Yes  . Drug Use: No  . Sexual Activity: Not on file   Other Topics Concern  . Not on file   Social History Narrative   Review of Systems  Constitutional: Negative for fever and chills.  Skin: Positive for color change. Negative for rash.   Objective:   Physical Exam  Constitutional: He is oriented to person, place, and time. He appears well-developed and well-nourished. No distress.  HENT:  Head: Normocephalic and atraumatic.  Eyes: Conjunctivae and EOM are normal.  Neck: Neck supple.  Cardiovascular: Normal rate.   Pulmonary/Chest: Effort normal.  Musculoskeletal: Normal range of motion.  Neurological: He is alert and oriented to person, place, and time.  Skin: Skin is warm and dry.  Multiple nevi on the scalp, most light brown in color. Largest measuring  6-7 mm . There is 1 on the left upper scalp measures approximately 1x64mm with slight scaling or dry appearance. No surrounding erythema.   Psychiatric: He has a normal mood and affect. His behavior is normal.  Nursing note and vitals reviewed.   Filed Vitals:   08/13/15 0813  BP: 102/70  Pulse: 78  Temp: 98.4 F (36.9 C)  TempSrc: Oral  Resp: 16  Height: 6' 1.5" (1.867 m)  Weight: 183 lb 3.2 oz (83.099 kg)  SpO2: 98%   Assessment &  Plan:  Chad Griffin is a 45 y.o. male Nevus of scalp - Plan: Ambulatory referral to Dermatology  - Multiple nevi of scalp, overall appear benign, one slightly scaly, minimally elevated on the left side, but this is still fairly Griffin at approximately 1 x 3 mm.   -Will refer to dermatology for mole mapping and evaluation of scalp to determine if biopsy or cryodestruction is indicated. As he is traveling out of the country, we'll try to have this appointment scheduled in the next few weeks.   Flu vaccine need - Plan: Flu Vaccine QUAD 36+ mos IM given.    No orders of the defined types were placed in this encounter.   Patient Instructions  I put in a referral  for dermatology and asked that they try to schedule that before February 1. Have a great trip.    Moles Moles are usually harmless growths on the skin. They are accumulations of color (pigment) cells in the skin that:   Can be various colors, from light brown to black.  Can appear anywhere on the body.  May remain flat or become raised.  May contain hairs.  May remain smooth or develop wrinkling. Most moles are not cancerous (benign). However, some moles may develop changes and become cancerous. It is important to check your moles every month. If you check your moles regularly, you will be able to notice any changes that may occur.  CAUSES  Moles occur when skin cells grow together in clusters instead of spreading out in the skin as they normally do. The reason for this clustering is unknown. DIAGNOSIS  Your caregiver will perform a skin examination to diagnose your mole.  TREATMENT  Moles usually do not require treatment. If a mole becomes worrisome, your caregiver may choose to take a sample of the mole or remove it entirely, and then send it to a lab for examination.  HOME CARE INSTRUCTIONS  Check your mole(s) monthly for changes that may indicate skin cancer. These changes can include:  A change in size.  A change in color. Note that moles tend to darken during pregnancy or when taking birth control pills (oral contraception).  A change in shape.  A change in the border of the mole.  Wear sunscreen (with an SPF of at least 47) when you spend long periods of time outside. Reapply the sunscreen every 2-3 hours.  Schedule annual appointments with your skin doctor (dermatologist) if you have a large number of moles. SEEK MEDICAL CARE IF:  Your mole changes size, especially if it becomes larger than a pencil eraser.  Your mole changes in color or develops more than one color.  Your mole becomes itchy or bleeds.  Your mole, or the skin near the mole, becomes painful, sore,  red, or swollen.  Your mole becomes scaly, sheds skin, or oozes fluid.  Your mole develops irregular borders.  Your mole becomes flat or develops raised areas.  Your mole becomes hard or soft.   This information is not intended to replace advice given to you by your health care provider. Make sure you discuss any questions you have with your health care provider.   Document Released: 04/04/2001 Document Revised: 04/03/2012 Document Reviewed: 01/22/2012 Elsevier Interactive Patient Education [employer].     I personally performed the services described in this documentation, which was scribed in my presence. The recorded information has been reviewed and considered, and addended by me as needed.

## 2015-11-24 ENCOUNTER — Encounter: Payer: BLUE CROSS/BLUE SHIELD | Admitting: Family Medicine

## 2015-12-06 ENCOUNTER — Encounter: Payer: 59 | Admitting: Family Medicine

## 2015-12-08 ENCOUNTER — Encounter: Payer: 59 | Admitting: Family Medicine

## 2016-01-12 ENCOUNTER — Encounter: Payer: Self-pay | Admitting: Family Medicine

## 2016-01-13 ENCOUNTER — Encounter: Payer: Self-pay | Admitting: Family Medicine

## 2016-01-20 ENCOUNTER — Encounter: Payer: Self-pay | Admitting: Family Medicine

## 2016-01-20 ENCOUNTER — Ambulatory Visit (INDEPENDENT_AMBULATORY_CARE_PROVIDER_SITE_OTHER): Payer: BLUE CROSS/BLUE SHIELD | Admitting: Family Medicine

## 2016-01-20 VITALS — BP 110/64 | HR 66 | Temp 98.0°F | Resp 16 | Ht 73.5 in | Wt 177.4 lb

## 2016-01-20 DIAGNOSIS — Z0000 Encounter for general adult medical examination without abnormal findings: Secondary | ICD-10-CM | POA: Diagnosis not present

## 2016-01-20 DIAGNOSIS — Z130 Encounter for screening for diseases of the blood and blood-forming organs and certain disorders involving the immune mechanism: Secondary | ICD-10-CM

## 2016-01-20 DIAGNOSIS — Z131 Encounter for screening for diabetes mellitus: Secondary | ICD-10-CM

## 2016-01-20 DIAGNOSIS — Z13220 Encounter for screening for lipoid disorders: Secondary | ICD-10-CM

## 2016-01-20 LAB — COMPLETE METABOLIC PANEL WITH GFR
ALT: 14 U/L (ref 9–46)
AST: 19 U/L (ref 10–40)
Albumin: 4.5 g/dL (ref 3.6–5.1)
Alkaline Phosphatase: 45 U/L (ref 40–115)
BUN: 11 mg/dL (ref 7–25)
CALCIUM: 9.7 mg/dL (ref 8.6–10.3)
CHLORIDE: 100 mmol/L (ref 98–110)
CO2: 27 mmol/L (ref 20–31)
Creat: 0.92 mg/dL (ref 0.60–1.35)
Glucose, Bld: 78 mg/dL (ref 65–99)
POTASSIUM: 3.8 mmol/L (ref 3.5–5.3)
Sodium: 137 mmol/L (ref 135–146)
Total Bilirubin: 1.2 mg/dL (ref 0.2–1.2)
Total Protein: 7.2 g/dL (ref 6.1–8.1)

## 2016-01-20 LAB — LIPID PANEL
CHOL/HDL RATIO: 3 ratio (ref <=5.0)
CHOLESTEROL: 169 mg/dL (ref 125–200)
HDL: 56 mg/dL (ref >=40)
LDL Cholesterol: 99 mg/dL (ref <130)
Triglycerides: 71 mg/dL (ref <150)
VLDL: 14 mg/dL (ref <30)

## 2016-01-20 LAB — CBC
HEMATOCRIT: 41.2 % (ref 38.5–50.0)
HEMOGLOBIN: 13.9 g/dL (ref 13.2–17.1)
MCH: 30.9 pg (ref 27.0–33.0)
MCHC: 33.7 g/dL (ref 32.0–36.0)
MCV: 91.6 fL (ref 80.0–100.0)
MPV: 9.9 fL (ref 7.5–12.5)
PLATELETS: 191 10*3/uL (ref 140–400)
RBC: 4.5 MIL/uL (ref 4.20–5.80)
RDW: 13.6 % (ref 11.0–15.0)
WBC: 5.5 10*3/uL (ref 3.8–10.8)

## 2016-01-20 NOTE — Progress Notes (Addendum)
Subjective:  By signing my name below, I, Chad Griffin, attest that this documentation has been prepared under the direction and in the presence of Chad Ray, MD. Electronically Signed: Moises Griffin, Albert Lea. 01/20/2016 , 2:22 PM .  Patient was seen in Room 22 .   Patient ID: Chad Griffin, male    DOB: 1971-03-17, 45 y.o.   MRN: [id number] Chief Complaint  Patient presents with  . Annual Exam   HPI  Chad Griffin is a 45 y.o. male Here for annual physical. Last physical was in March 2016. He is fasting today.   Cancer Screening He did have 2 areas on the scalp noted in January. He was referred to dermatology.   He was able to see dermatology about 2 weeks ago and was informed that the 2 areas weren't concerning. Patient also mentions the 2 areas peeled off.   Family History He notes paternal grandmother had lymphoma but heavy smoker.  Paternal grandfather had prostate cancer. Patient notes having nocturia once every night.   Immunizations Immunization History  Administered Date(s) Administered  . Influenza,inj,Quad PF,36+ Mos 08/13/2015  . Tdap 04/22/2010    Depression Depression screen [facility] 2/9 01/20/2016 08/13/2015 11/13/2014 10/14/2014  Decreased Interest 0 0 0 0  Down, Depressed, Hopeless 0 0 0 0  PHQ - 2 Score 0 0 0 0    Vision  Visual Acuity Screening   Right eye Left eye Both eyes  Without correction: 20/20 20/20 20/20   With correction:      He denies seeing eye doctor recently.   Dentist He saw dentist recently.   Exercise He's active in exercise with swimming, yoga and working in the yard.   STI testing He had HIV testing done about 10 years ago. He denies any known risk factors.  He denies noticing any new lumps or bumps in genitalia.   There are no active problems to display for this patient.  No past medical history on file. No past surgical history on file. Allergies  Allergen Reactions  . Penicillins Swelling   Prior to Admission  medications   Not on File   Social History   Social History  . Marital Status: Single    Spouse Name: N/A  . Number of Children: N/A  . Years of Education: N/A   Occupational History  . PROFESSOR    Social History Main Topics  . Smoking status: Never Smoker   . Smokeless tobacco: Never Used  . Alcohol Use: 0.0 oz/week    0 Standard drinks or equivalent per week     Comment: BEER  . Drug Use: No  . Sexual Activity: Not on file   Other Topics Concern  . Not on file   Social History Narrative   Exercise work in the yard, yoga and swimming   Review of Systems 13 point ROS - negative     Objective:   Physical Exam  Constitutional: He is oriented to person, place, and time. He appears well-developed and well-nourished.  HENT:  Head: Normocephalic and atraumatic.  Right Ear: External ear normal.  Left Ear: External ear normal.  Mouth/Throat: Oropharynx is clear and moist.  Eyes: Conjunctivae and EOM are normal. Pupils are equal, round, and reactive to light.  Neck: Normal range of motion. Neck supple. No thyromegaly present.  Cardiovascular: Normal rate, regular rhythm, normal heart sounds and intact distal pulses.   Pulmonary/Chest: Effort normal and breath sounds normal. No respiratory distress. He has no wheezes.  Abdominal: Soft. He exhibits  no distension. There is no tenderness. Hernia confirmed negative in the right inguinal area and confirmed negative in the left inguinal area.  Musculoskeletal: Normal range of motion. He exhibits no edema or tenderness.  Lymphadenopathy:    He has no cervical adenopathy.  Neurological: He is alert and oriented to person, place, and time. He has normal reflexes.  Skin: Skin is warm and dry.  Psychiatric: He has a normal mood and affect. His behavior is normal.  Vitals reviewed.   Filed Vitals:   01/20/16 1323  BP: 110/64  Pulse: 66  Temp: 98 F (36.7 C)  TempSrc: Oral  Resp: 16  Height: 6' 1.5" (1.867 m)  Weight: 177 lb  6.4 oz (80.468 kg)  SpO2: 98%      Assessment & Plan:  Chad Griffin is a 45 y.o. male Annual physical exam  --anticipatory guidance as below in AVS, screening labs above. Health maintenance items as above in HPI discussed/recommended as applicable.   Screening for diabetes mellitus - Plan: COMPLETE METABOLIC PANEL WITH GFR  Screening for hyperlipidemia - Plan: Lipid panel  Screening, anemia, deficiency, iron - Plan: CBC   No orders of the defined types were placed in this encounter.   Patient Instructions       IF you received an x-Griffin today, you will receive an invoice from [facility]. Please contact [facility] at [phone] with questions or concerns regarding your invoice.   IF you received labwork today, you will receive an invoice from [employer]. Please contact [facility] at [phone] with questions or concerns regarding your invoice.   Our billing staff will not be able to assist you with questions regarding bills from these companies.  You will be contacted with the lab results as soon as they are available. The fastest way to get your results is to activate your My Chart account. Instructions are located on the last page of this paperwork. If you have not heard from [place] regarding the results in 2 weeks, please contact this office.    Keeping you healthy  Get these tests  Griffin pressure- Have your Griffin pressure checked once a year by your healthcare provider.  Normal Griffin pressure is 120/80.  Weight- Have your body mass index (BMI) calculated to screen for obesity.  BMI is a measure of body fat based on height and weight. You can also calculate your own BMI at [employer].it.  Cholesterol- Have your cholesterol checked regularly starting at age 67, sooner may be necessary if you have diabetes, high Griffin pressure, if a family member developed heart diseases at an early age or if you smoke.   Chlamydia,  HIV, and other sexual transmitted disease- Get screened each year until the age of 41 then within three months of each new sexual partner.  Diabetes- Have your Griffin sugar checked regularly if you have high Griffin pressure, high cholesterol, a family history of diabetes or if you are overweight.  Get these vaccines  Flu shot- Every fall.  Tetanus shot- Every 10 years.  Menactra- Single dose; prevents meningitis.  Take these steps  Don't smoke- If you do smoke, ask your healthcare provider about quitting. For tips on how to quit, go to www.smokefree.gov or call 1-800-QUIT-NOW.  Be physically active- Exercise 5 days a week for at least 30 minutes.  If you are not already physically active start slow and gradually work up to 30 minutes of moderate physical activity.  Examples of moderate activity include walking briskly, mowing  the yard, dancing, swimming bicycling, etc.  Eat a healthy diet- Eat a variety of healthy foods such as fruits, vegetables, low fat milk, low fat cheese, yogurt, lean meats, poultry, fish, beans, tofu, etc.  For more information on healthy eating, go to www.thenutritionsource.org  Drink alcohol in moderation- Limit alcohol intake two drinks or less a day.  Never drink and drive.  Dentist- Brush and floss teeth twice daily; visit your dentis twice a year.  Depression-Your emotional health is as important as your physical health.  If you're feeling down, losing interest in things you normally enjoy please talk with your healthcare provider.  Gun Safety- If you keep a gun in your home, keep it unloaded and with the safety lock on.  Bullets should be stored separately.  Helmet use- Always wear a helmet when riding a motorcycle, bicycle, rollerblading or skateboarding.  Safe sex- If you may be exposed to a sexually transmitted infection, use a condom  Seat belts- Seat bels can save your life; always wear one.  Smoke/Carbon Monoxide detectors- These detectors need to  be installed on the appropriate level of your home.  Replace batteries at least once a year.  Skin Cancer- When out in the sun, cover up and use sunscreen SPF 15 or higher.  Violence- If anyone is threatening or hurting you, please tell your healthcare provider.   I personally performed the services described in this documentation, which was scribed in my presence. The recorded information has been reviewed and considered, and addended by me as needed.   Signed,   Chad Ray, MD [facility].  01/20/2016 2:26 PM

## 2016-01-20 NOTE — Patient Instructions (Addendum)
   IF you received an x-ray today, you will receive an invoice from [facility]. Please contact [facility] at [phone] with questions or concerns regarding your invoice.   IF you received labwork today, you will receive an invoice from [facility]. Please contact [facility] at [phone] with questions or concerns regarding your invoice.   Our billing staff will not be able to assist you with questions regarding bills from these companies.  You will be contacted with the lab results as soon as they are available. The fastest way to get your results is to activate your My Chart account. Instructions are located on the last page of this paperwork. If you have not heard from us regarding the results in 2 weeks, please contact this office.    Keeping you healthy  Get these tests  Blood pressure- Have your blood pressure checked once a year by your healthcare provider.  Normal blood pressure is 120/80.  Weight- Have your body mass index (BMI) calculated to screen for obesity.  BMI is a measure of body fat based on height and weight. You can also calculate your own BMI at [email or web address]/bmi/.  Cholesterol- Have your cholesterol checked regularly starting at age 35, sooner may be necessary if you have diabetes, high blood pressure, if a family member developed heart diseases at an early age or if you smoke.   Chlamydia, HIV, and other sexual transmitted disease- Get screened each year until the age of 25 then within three months of each new sexual partner.  Diabetes- Have your blood sugar checked regularly if you have high blood pressure, high cholesterol, a family history of diabetes or if you are overweight.  Get these vaccines  Flu shot- Every fall.  Tetanus shot- Every 10 years.  Menactra- Single dose; prevents meningitis.  Take these steps  Don't smoke- If you do smoke, ask your healthcare provider about quitting. For tips on  how to quit, go to www.smokefree.gov or call 1-800-QUIT-NOW.  Be physically active- Exercise 5 days a week for at least 30 minutes.  If you are not already physically active start slow and gradually work up to 30 minutes of moderate physical activity.  Examples of moderate activity include walking briskly, mowing the yard, dancing, swimming bicycling, etc.  Eat a healthy diet- Eat a variety of healthy foods such as fruits, vegetables, low fat milk, low fat cheese, yogurt, lean meats, poultry, fish, beans, tofu, etc.  For more information on healthy eating, go to www.thenutritionsource.org  Drink alcohol in moderation- Limit alcohol intake two drinks or less a day.  Never drink and drive.  Dentist- Brush and floss teeth twice daily; visit your dentis twice a year.  Depression-Your emotional health is as important as your physical health.  If you're feeling down, losing interest in things you normally enjoy please talk with your healthcare provider.  Gun Safety- If you keep a gun in your home, keep it unloaded and with the safety lock on.  Bullets should be stored separately.  Helmet use- Always wear a helmet when riding a motorcycle, bicycle, rollerblading or skateboarding.  Safe sex- If you may be exposed to a sexually transmitted infection, use a condom  Seat belts- Seat bels can save your life; always wear one.  Smoke/Carbon Monoxide detectors- These detectors need to be installed on the appropriate level of your home.  Replace batteries at least once a year.  Skin Cancer- When out in the sun, cover up and use sunscreen SPF   15 or higher.  Violence- If anyone is threatening or hurting you, please tell your healthcare provider. 

## 2016-09-02 ENCOUNTER — Ambulatory Visit: Payer: BLUE CROSS/BLUE SHIELD

## 2016-09-07 ENCOUNTER — Ambulatory Visit (INDEPENDENT_AMBULATORY_CARE_PROVIDER_SITE_OTHER): Payer: BLUE CROSS/BLUE SHIELD

## 2016-09-07 ENCOUNTER — Ambulatory Visit (INDEPENDENT_AMBULATORY_CARE_PROVIDER_SITE_OTHER): Payer: BLUE CROSS/BLUE SHIELD | Admitting: Family Medicine

## 2016-09-07 VITALS — BP 118/76 | HR 68 | Temp 97.8°F | Resp 16 | Ht 74.0 in | Wt 189.0 lb

## 2016-09-07 DIAGNOSIS — Z8719 Personal history of other diseases of the digestive system: Secondary | ICD-10-CM

## 2016-09-07 DIAGNOSIS — R109 Unspecified abdominal pain: Secondary | ICD-10-CM | POA: Diagnosis not present

## 2016-09-07 DIAGNOSIS — K5901 Slow transit constipation: Secondary | ICD-10-CM | POA: Diagnosis not present

## 2016-09-07 NOTE — Patient Instructions (Addendum)
  For your constipation, we'll need to try several ways to treat this:  1)  First, take a stool softener twice daily for the next week.  After that, just take it once daily. 2) Take either Miralax or prune juice once in the AM and once in the PM for the next several days.  Stop taking it if you have diarrhea.  You can increase this to 3 times a day to help you go as well. 3) If you still haven't gone to the restroom after three days, it's time to try either a suppository or an enema.  This should make you go. 4) Continue to take the stool softener and Miralax, even if you use the suppository.  Your goal is to have a soft bowel movement once daily. Once you start going to the bathroom, cut back to once or twice daily Miralax/prune until you achieve that goal.    It was good to meet you today.   Contact me through MyChart if you're still having issues in the next 3 weeks or so.  Then we'll refer you to a gastroenterologist.     IF you received an x-ray today, you will receive an invoice from [facility]. Please contact [facility] at [phone] with questions or concerns regarding your invoice.   IF you received labwork today, you will receive an invoice from [facility]. Please contact [facility] at [phone] with questions or concerns regarding your invoice.   Our billing staff will not be able to assist you with questions regarding bills from these companies.  You will be contacted with the lab results as soon as they are available. The fastest way to get your results is to activate your My Chart account. Instructions are located on the last page of this paperwork. If you have not heard from [place] regarding the results in 2 weeks, please contact this office.

## 2016-09-07 NOTE — Progress Notes (Signed)
   Chad Griffin is a 46 y.o. male who presents to [facility] today for abdominal discomfort:  1.  Abdominal discomfort:  Patient by his own admission states he has trouble describing his symptoms. He has chronic abdominal discomfort" for the past several years. About 4 years ago in 2014 he presented with the same regimen present for about 6 months. He had an ultrasound and CT scan at that time. Both were read as negative, although CT scan did reveal fair amount of constipation.  Patient states it "feels like a foreign body." He notes when he is sleeping on his failure sinus with movement. He states it is not present all the time. He is a Marine scientist heavy objects. He wonders if he has a hernia. He denies any nocturia, BPH symptoms, fevers or chills, melena, hematochezia, nausea or vomiting. He eats regularly and hasn't missed any meals. He is a vegetarian and eats a fair amount of salads during the day.   No abdominal trauma. No history of colon cancer. He has never had a colonoscopy.  No family history of colon cancer.   He states that he has a normal bowel movement daily. No diarrhea. He denies any personal history of constipation.   ROS as above.     PMH reviewed. Patient is a nonsmoker.   No past medical history on file. No past surgical history on file.  Medications reviewed. No current outpatient prescriptions on file.   No current facility-administered medications for this visit.      Physical Exam:  BP 118/76 (BP Location: Right Arm, Patient Position: Sitting, Cuff Size: Normal)   Pulse 68   Temp 97.8 F (36.6 C) (Oral)   Resp 16   Ht 6\' 2"  (1.88 m)   Wt 189 lb (85.7 kg)   SpO2 100%   BMI 24.27 kg/m  Gen:  Alert, cooperative patient who appears stated age in no acute distress.  Vital signs reviewed. HEENT: EOMI,  MMM Pulm:  Clear to auscultation bilaterally with good air movement.  No wheezes or rales noted.   Cardiac:  Regular rate  and rhythm without murmur auscultated.  Good S1/S2. Abd:  Soft/nondistended/nontender.  Palpable stool burden palpated LUQ.  Good BS throughout. Exts: Non edematous BL  LE, warm and well perfused.  Rectal:  No external hemorrhoids.  No stool in vault.  Good rectal tone.  Prostate normal and not excessively tender.  No masses  Assessment and Plan:  1.  Constipation: - KUB today shows fair amount of stool even to the ascending colon on the right side.  This despite fact he had BM earlier today -- which explains lack of stool in rectum -Plan is to treat patient as constipation. - He has no masses on his rectal exam. -He will attempt a bowel cleanout for the next 2-3 weeks.  See instructions for details.  -It he has not had any relief or if the sensation still persists despite having good bowel movements we will refer him to gastroenterology for colonoscopy and further evaluation and recommendations. - No red flags - He plans to contact us through the my chart or from the next 3 weeks.

## 2016-10-07 ENCOUNTER — Encounter: Payer: Self-pay | Admitting: Family Medicine

## 2016-12-20 ENCOUNTER — Telehealth: Payer: Self-pay | Admitting: Family Medicine

## 2016-12-20 NOTE — Telephone Encounter (Signed)
LMOM FOR PT TO CALL AND RESCHEDULE APPOINTMENT THAT WAS MADE FOR 01-25-17 FOR A COMPLETE PHYSICAL GREENE WILL BE OUT OF THE OFFICE THAT DAY

## 2017-01-25 ENCOUNTER — Encounter: Payer: BLUE CROSS/BLUE SHIELD | Admitting: Family Medicine

## 2017-02-01 ENCOUNTER — Ambulatory Visit (INDEPENDENT_AMBULATORY_CARE_PROVIDER_SITE_OTHER): Payer: BLUE CROSS/BLUE SHIELD | Admitting: Family Medicine

## 2017-02-01 ENCOUNTER — Encounter: Payer: Self-pay | Admitting: Family Medicine

## 2017-02-01 VITALS — BP 115/76 | HR 59 | Temp 98.2°F | Resp 16 | Ht 74.0 in | Wt 179.4 lb

## 2017-02-01 DIAGNOSIS — Z131 Encounter for screening for diabetes mellitus: Secondary | ICD-10-CM

## 2017-02-01 DIAGNOSIS — R002 Palpitations: Secondary | ICD-10-CM

## 2017-02-01 DIAGNOSIS — R198 Other specified symptoms and signs involving the digestive system and abdomen: Secondary | ICD-10-CM | POA: Diagnosis not present

## 2017-02-01 DIAGNOSIS — R238 Other skin changes: Secondary | ICD-10-CM | POA: Diagnosis not present

## 2017-02-01 DIAGNOSIS — Z13220 Encounter for screening for lipoid disorders: Secondary | ICD-10-CM

## 2017-02-01 DIAGNOSIS — M7742 Metatarsalgia, left foot: Secondary | ICD-10-CM | POA: Diagnosis not present

## 2017-02-01 DIAGNOSIS — Z0000 Encounter for general adult medical examination without abnormal findings: Secondary | ICD-10-CM | POA: Diagnosis not present

## 2017-02-01 DIAGNOSIS — R195 Other fecal abnormalities: Secondary | ICD-10-CM | POA: Diagnosis not present

## 2017-02-01 NOTE — Patient Instructions (Addendum)
See info on palpitations below, but if those change or occur more frequently, return to discuss further. I will check a thyroid test, but that has been normal prior.   For change in bowel movements, and abdominal sensation, it still may be due to constipation as seen in previous imaging.   Try colace once per day, probiotic slowly increase fiber in diet and drink plenty of fluids. I will also refer you to gastroenterologist to discuss these symptoms further.   Try aveeno lotion to top of scalp for itching, but if sensation persists, have your dermatologist evaluate that area.   Try the insert for left foot area. If sensation does not improve in next few weeks - return for possible xray or other evaluation.   Keeping you healthy  Get these tests  Blood pressure- Have your blood pressure checked once a year by your healthcare provider.  Normal blood pressure is 120/80.  Weight- Have your body mass index (BMI) calculated to screen for obesity.  BMI is a measure of body fat based on height and weight. You can also calculate your own BMI at [employer]velBags.it.  Cholesterol- Have your cholesterol checked regularly starting at age 41, sooner may be necessary if you have diabetes, high blood pressure, if a family member developed heart diseases at an early age or if you smoke.   Chlamydia, HIV, and other sexual transmitted disease- Get screened each year until the age of 85 then within three months of each new sexual partner.  Diabetes- Have your blood sugar checked regularly if you have high blood pressure, high cholesterol, a family history of diabetes or if you are overweight.  Get these vaccines  Flu shot- Every fall.  Tetanus shot- Every 10 years.  Menactra- Single dose; prevents meningitis.  Take these steps  Don't smoke- If you do smoke, ask your healthcare provider about quitting. For tips on how to quit, go to www.smokefree.gov or call 1-800-QUIT-NOW.  Be physically  active- Exercise 5 days a week for at least 30 minutes.  If you are not already physically active start slow and gradually work up to 30 minutes of moderate physical activity.  Examples of moderate activity include walking briskly, mowing the yard, dancing, swimming bicycling, etc.  Eat a healthy diet- Eat a variety of healthy foods such as fruits, vegetables, low fat milk, low fat cheese, yogurt, lean meats, poultry, fish, beans, tofu, etc.  For more information on healthy eating, go to www.thenutritionsource.org  Drink alcohol in moderation- Limit alcohol intake two drinks or less a day.  Never drink and drive.  Dentist- Brush and floss teeth twice daily; visit your dentis twice a year.  Depression-Your emotional health is as important as your physical health.  If you're feeling down, losing interest in things you normally enjoy please talk with your healthcare provider.  Gun Safety- If you keep a gun in your home, keep it unloaded and with the safety lock on.  Bullets should be stored separately.  Helmet use- Always wear a helmet when riding a motorcycle, bicycle, rollerblading or skateboarding.  Safe sex- If you may be exposed to a sexually transmitted infection, use a condom  Seat belts- Seat bels can save your life; always wear one.  Smoke/Carbon Monoxide detectors- These detectors need to be installed on the appropriate level of your home.  Replace batteries at least once a year.  Skin Cancer- When out in the sun, cover up and use sunscreen SPF 15 or higher.  Violence- If anyone  is threatening or hurting you, please tell your healthcare provider.    High-Fiber Diet Fiber, also called dietary fiber, is a type of carbohydrate found in fruits, vegetables, whole grains, and beans. A high-fiber diet can have many health benefits. Your health care provider may recommend a high-fiber diet to help:  Prevent constipation. Fiber can make your bowel movements more regular.  Lower your  cholesterol.  Relieve hemorrhoids, uncomplicated diverticulosis, or irritable bowel syndrome.  Prevent overeating as part of a weight-loss plan.  Prevent heart disease, type 2 diabetes, and certain cancers.  What is my plan? The recommended daily intake of fiber includes:  38 grams for men under age 28.  34 grams for men over age 31.  82 grams for women under age 17.  25 grams for women over age 68.  You can get the recommended daily intake of dietary fiber by eating a variety of fruits, vegetables, grains, and beans. Your health care provider may also recommend a fiber supplement if it is not possible to get enough fiber through your diet. What do I need to know about a high-fiber diet?  Fiber supplements have not been widely studied for their effectiveness, so it is better to get fiber through food sources.  Always check the fiber content on thenutrition facts label of any prepackaged food. Look for foods that contain at least 5 grams of fiber per serving.  Ask your dietitian if you have questions about specific foods that are related to your condition, especially if those foods are not listed in the following section.  Increase your daily fiber consumption gradually. Increasing your intake of dietary fiber too quickly may cause bloating, cramping, or gas.  Drink plenty of water. Water helps you to digest fiber. What foods can I eat? Grains Whole-grain breads. Multigrain cereal. Oats and oatmeal. Brown rice. Barley. Bulgur wheat. [place]. Bran muffins. Popcorn. Rye wafer crackers. Vegetables Sweet potatoes. Spinach. Kale. Artichokes. Cabbage. Broccoli. Green peas. Carrots. Squash. Fruits Berries. Pears. Apples. Oranges. Avocados. Prunes and raisins. Dried figs. Meats and Other Protein Sources Navy, kidney, pinto, and soy beans. Split peas. Lentils. Nuts and seeds. Dairy Fiber-fortified yogurt. Beverages Fiber-fortified soy milk. Fiber-fortified orange juice. Other Fiber  bars. The items listed above may not be a complete list of recommended foods or beverages. Contact your dietitian for more options. What foods are not recommended? Grains White bread. Pasta made with refined flour. White rice. Vegetables Fried potatoes. Canned vegetables. Well-cooked vegetables. Fruits Fruit juice. Cooked, strained fruit. Meats and Other Protein Sources Fatty cuts of meat. Fried Sales executive or fried fish. Dairy Milk. Yogurt. Cream cheese. Sour cream. Beverages Soft drinks. Other Cakes and pastries. Butter and oils. The items listed above may not be a complete list of foods and beverages to avoid. Contact your dietitian for more information. What are some tips for including high-fiber foods in my diet?  Eat a wide variety of high-fiber foods.  Make sure that half of all grains consumed each day are whole grains.  Replace breads and cereals made from refined flour or white flour with whole-grain breads and cereals.  Replace white rice with brown rice, bulgur wheat, or millet.  Start the day with a breakfast that is high in fiber, such as a cereal that contains at least 5 grams of fiber per serving.  Use beans in place of meat in soups, salads, or pasta.  Eat high-fiber snacks, such as berries, raw vegetables, nuts, or popcorn. This information is not intended to replace  advice given to you by your health care provider. Make sure you discuss any questions you have with your health care provider. Document Released: 07/10/2005 Document Revised: 12/16/2015 Document Reviewed: 12/23/2013 Elsevier Interactive Patient Education  2017 [employer].   Palpitations A palpitation is the feeling that your heartbeat is irregular or is faster than normal. It may feel like your heart is fluttering or skipping a beat. Palpitations are usually not a serious problem. They may be caused by many things, including smoking, caffeine, alcohol, stress, and certain medicines. Although most  causes of palpitations are not serious, palpitations can be a sign of a serious medical problem. In some cases, you may need further medical evaluation. Follow these instructions at home: Pay attention to any changes in your symptoms. Take these actions to help with your condition:  Avoid the following: ? Caffeinated coffee, tea, soft drinks, diet pills, and energy drinks. ? Chocolate. ? Alcohol.  Do not use any tobacco products, such as cigarettes, chewing tobacco, and e-cigarettes. If you need help quitting, ask your health care provider.  Try to reduce your stress and anxiety. Things that can help you relax include: ? Yoga. ? Meditation. ? Physical activity, such as swimming, jogging, or walking. ? Biofeedback. This is a method that helps you learn to use your mind to control things in your body, such as your heartbeats.  Get plenty of rest and sleep.  Take over-the-counter and prescription medicines only as told by your health care provider.  Keep all follow-up visits as told by your health care provider. This is important.  Contact a health care provider if:  You continue to have a fast or irregular heartbeat after 24 hours.  Your palpitations occur more often. Get help right away if:  You have chest pain or shortness of breath.  You have a severe headache.  You feel dizzy or you faint. This information is not intended to replace advice given to you by your health care provider. Make sure you discuss any questions you have with your health care provider. Document Released: 07/07/2000 Document Revised: 12/13/2015 Document Reviewed: 03/25/2015 Elsevier Interactive Patient Education  2017 [employer].    IF you received an x-ray today, you will receive an invoice from [facility]. Please contact [facility] at [phone] with questions or concerns regarding your invoice.   IF you received labwork today, you will receive an invoice from [facility].  Please contact [facility] at [phone] with questions or concerns regarding your invoice.   Our billing staff will not be able to assist you with questions regarding bills from these companies.  You will be contacted with the lab results as soon as they are available. The fastest way to get your results is to activate your My Chart account. Instructions are located on the last page of this paperwork. If you have not heard from [place] regarding the results in 2 weeks, please contact this office.

## 2017-02-01 NOTE — Progress Notes (Signed)
Subjective:  By signing my name below, I, Moises Blood, attest that this documentation has been prepared under the direction and in the presence of Merri Ray, MD. Electronically Signed: Moises Blood, [place]. 02/01/2017 , 2:49 PM .  Patient was seen in Room 26 .   Patient ID: Chad Griffin, male    DOB: [birth year], 46 y.o.   MRN: [id number] Chief Complaint  Patient presents with  . Annual Exam   HPI Chad Griffin is a 46 y.o. male Here for annual physical. His last physical with me was in June 2017. He also presents with multiple concerns today. He is fasting today.   Abdominal issues He mentions that he's had a sensation of having a mass in his LUQ. He initially informed Dr. Laney Pastor many years ago but was under whelmed regarding this issue. Then, later in 2014, patient was seen by Dr. Lorelei Pont, and was sent for ultrasound of abdomen on 11/01/12, which was negative. He also had CT abdomen/pelvis on 11/18/12, which showed constipation otherwise no LUQ mass.   About 6 months ago, he was seen by Dr. Mingo Amber and had xray of abdomen, which showed constipation. He was having harder stools with pellet stools and change in his bowel movements. He was advised to take stool softener, Miralax, up to TID and drinking prune juice. He had some bloating with this, but had some relief. He also lays on his stomach at night when sleeping and feels this pain.   He still has bowel movement daily, but sometimes harder stools, and sometimes extra soft. He hasn't met with a GI yet. He isn't taking any medications right now. He has family history of colon cancer in maternal grandfather.   Bump on bottom of left foot He's noticed a bump on the bottom of his left foot a week ago. He denies drainage or any pain.   Scalp sensation He mentions bumps still over his scalp. He did see dermatologist about 6 months ago and received a treatment for it, but still has some tingling. He denies applying any creams or  lotions over the area.   Seasonal allergies He's been having congestion and sinus pressure with seasonal allergies. He's been using nasal sprays.   Mouth sores He has intermittent canker sores inside of his mouth. He denies any blisters appearing on the exterior of his mouth, or around the corner of his lips.   Palpitations He states this has been present since he was a child. He notices palpitations and extra beat about once a week. His last EKG was done in 2015 as well as TSH which was normal.    Cancer Screening Skin cancer screening: He was evaluated by dermatologist in the past without any known skin cancer.  Prostate cancer screening: He noted nocturia once a night when discussed last year; paternal grandfather with history of prostate cancer. He denies any changes with nocturia, as well as other urinary symptoms.  Colon cancer screening: family history of colon cancer in maternal grandfather.    Immunizations Immunization History  Administered Date(s) Administered  . Influenza,inj,Quad PF,36+ Mos 08/13/2015, 04/07/2016  . Tdap 04/22/2010     Depression Depression screen [facility] 2/9 02/01/2017 09/07/2016 01/20/2016 08/13/2015 11/13/2014  Decreased Interest 0 0 0 0 0  Down, Depressed, Hopeless 0 0 0 0 0  PHQ - 2 Score 0 0 0 0 0     Vision  Visual Acuity Screening   Right eye Left eye Both eyes  Without correction: 20/13 20/13 20/13  With correction:      He does not have an eye doctor.   Dentist He sees his dentist every 6 months.   Exercise He is generally active. He does swim x2-3 a week, as well as joining yoga class sometimes.   STI testing He denies need of STI testing.   HIV screening He states he had this screening done in around 2004.   There are no active problems to display for this patient.  Past Medical History:  Diagnosis Date  . Allergy    History reviewed. No pertinent surgical history. Allergies  Allergen Reactions  . Penicillins Swelling    Prior to Admission medications   Not on File   Social History   Social History  . Marital status: Single    Spouse name: N/A  . Number of children: N/A  . Years of education: N/A   Occupational History  . PROFESSOR    Social History Main Topics  . Smoking status: Never Smoker  . Smokeless tobacco: Never Used  . Alcohol use 0.0 oz/week     Comment: BEER  . Drug use: No  . Sexual activity: Not on file   Other Topics Concern  . Not on file   Social History Narrative   Exercise work in the yard, yoga and swimming   Review of Systems  HENT: Positive for congestion, mouth sores and sinus pressure.   Cardiovascular: Positive for palpitations.  Gastrointestinal: Positive for abdominal pain, constipation and diarrhea.  Genitourinary: Negative for dysuria and hematuria.  Skin: Positive for color change.  Allergic/Immunologic: Positive for environmental allergies.       Objective:   Physical Exam  Constitutional: He is oriented to person, place, and time. He appears well-developed and well-nourished.  HENT:  Head: Normocephalic and atraumatic.  Right Ear: External ear normal.  Left Ear: External ear normal.  Mouth/Throat: Oropharynx is clear and moist.  Has a few patches over dorsal scalp with few scattered nevi, otherwise skin intact  Eyes: Pupils are equal, round, and reactive to light. Conjunctivae and EOM are normal.  Neck: Normal range of motion. Neck supple. No thyromegaly present.  Cardiovascular: Normal rate, regular rhythm, normal heart sounds and intact distal pulses.   Pulmonary/Chest: Effort normal and breath sounds normal. No respiratory distress. He has no wheezes.  Abdominal: Soft. He exhibits no distension and no mass. There is no tenderness. Hernia confirmed negative in the right inguinal area and confirmed negative in the left inguinal area.  Musculoskeletal: Normal range of motion. He exhibits no edema or tenderness.  Lymphadenopathy:    He has no  cervical adenopathy.  Neurological: He is alert and oriented to person, place, and time. He has normal reflexes.  Skin: Skin is warm and dry.  Left foot: prominence at base of 4th metatarsal head plantar aspect, skin intact  Psychiatric: He has a normal mood and affect. His behavior is normal.  Vitals reviewed.   Vitals:   02/01/17 1328  BP: 115/76  Pulse: (!) 59  Resp: 16  Temp: 98.2 F (36.8 C)  TempSrc: Oral  SpO2: 100%  Weight: 179 lb 6.4 oz (81.4 kg)  Height: '6\' 2"'  (1.88 m)   EKG: borderline bradycardia rate 49, no acute findings     Assessment & Plan:    Keval Nam is a 46 y.o. male Annual physical exam  - -anticipatory guidance as below in AVS, screening labs above. Health maintenance items as above in HPI discussed/recommended as applicable.   Metatarsalgia of  left foot  - trial of metatarsal cookie But appears to be more proximal than metatarsal head. Consider imaging if not improving in next week or 2.  Dry scalp  -Can try Aveeno over-the-counter, but recommended follow-up with dermatology if that does not resolve symptoms fairly quickly.  Abdominal fullness - Plan: Ambulatory referral to Gastroenterology Increased stool volume - Plan: Ambulatory referral to Gastroenterology  -Still suspect constipation as primary cause of symptoms. Trial of probiotic, Colace, increase fiber and fluid in diet, but will refer to gastroenterology due to persistence of symptoms, stool change, and family history of colon cancer to determine if colonoscopy indicated.   Heart palpitations - Plan: TSH, EKG 12-Lead  -check TSH. EKG without concerning findings. Intermittent, rare symptoms. RTC precautions if more frequent as may need cardiology eval/monitoring. Handout given.  Screening for diabetes mellitus - Plan: Comprehensive metabolic panel  Screening for hyperlipidemia - Plan: Comprehensive metabolic panel, Lipid panel   No orders of the defined types were placed in this  encounter.  Patient Instructions   See info on palpitations below, but if those change or occur more frequently, return to discuss further. I will check a thyroid test, but that has been normal prior.   For change in bowel movements, and abdominal sensation, it still may be due to constipation as seen in previous imaging.   Try colace once per day, probiotic slowly increase fiber in diet and drink plenty of fluids. I will also refer you to gastroenterologist to discuss these symptoms further.   Try aveeno lotion to top of scalp for itching, but if sensation persists, have your dermatologist evaluate that area.   Try the insert for left foot area. If sensation does not improve in next few weeks - return for possible xray or other evaluation.   Keeping you healthy  Get these tests  Blood pressure- Have your blood pressure checked once a year by your healthcare provider.  Normal blood pressure is 120/80.  Weight- Have your body mass index (BMI) calculated to screen for obesity.  BMI is a measure of body fat based on height and weight. You can also calculate your own BMI at [employer]velB[employer].it.  Cholesterol- Have your cholesterol checked regularly starting at age 8, sooner may be necessary if you have diabetes, high blood pressure, if a family member developed heart diseases at an early age or if you smoke.   Chlamydia, HIV, and other sexual transmitted disease- Get screened each year until the age of 33 then within three months of each new sexual partner.  Diabetes- Have your blood sugar checked regularly if you have high blood pressure, high cholesterol, a family history of diabetes or if you are overweight.  Get these vaccines  Flu shot- Every fall.  Tetanus shot- Every 10 years.  Menactra- Single dose; prevents meningitis.  Take these steps  Don't smoke- If you do smoke, ask your healthcare provider about quitting. For tips on how to quit, go to www.smokefree.gov or call  1-800-QUIT-NOW.  Be physically active- Exercise 5 days a week for at least 30 minutes.  If you are not already physically active start slow and gradually work up to 30 minutes of moderate physical activity.  Examples of moderate activity include walking briskly, mowing the yard, dancing, swimming bicycling, etc.  Eat a healthy diet- Eat a variety of healthy foods such as fruits, vegetables, low fat milk, low fat cheese, yogurt, lean meats, poultry, fish, beans, tofu, etc.  For more information on healthy eating, go  to www.thenutritionsource.org  Drink alcohol in moderation- Limit alcohol intake two drinks or less a day.  Never drink and drive.  Dentist- Brush and floss teeth twice daily; visit your dentis twice a year.  Depression-Your emotional health is as important as your physical health.  If you're feeling down, losing interest in things you normally enjoy please talk with your healthcare provider.  Gun Safety- If you keep a gun in your home, keep it unloaded and with the safety lock on.  Bullets should be stored separately.  Helmet use- Always wear a helmet when riding a motorcycle, bicycle, rollerblading or skateboarding.  Safe sex- If you may be exposed to a sexually transmitted infection, use a condom  Seat belts- Seat bels can save your life; always wear one.  Smoke/Carbon Monoxide detectors- These detectors need to be installed on the appropriate level of your home.  Replace batteries at least once a year.  Skin Cancer- When out in the sun, cover up and use sunscreen SPF 15 or higher.  Violence- If anyone is threatening or hurting you, please tell your healthcare provider.    High-Fiber Diet Fiber, also called dietary fiber, is a type of carbohydrate found in fruits, vegetables, whole grains, and beans. A high-fiber diet can have many health benefits. Your health care provider may recommend a high-fiber diet to help:  Prevent constipation. Fiber can make your bowel movements  more regular.  Lower your cholesterol.  Relieve hemorrhoids, uncomplicated diverticulosis, or irritable bowel syndrome.  Prevent overeating as part of a weight-loss plan.  Prevent heart disease, type 2 diabetes, and certain cancers.  What is my plan? The recommended daily intake of fiber includes:  38 grams for men under age 58.  8 grams for men over age 50.  18 grams for women under age 1.  87 grams for women over age 42.  You can get the recommended daily intake of dietary fiber by eating a variety of fruits, vegetables, grains, and beans. Your health care provider may also recommend a fiber supplement if it is not possible to get enough fiber through your diet. What do I need to know about a high-fiber diet?  Fiber supplements have not been widely studied for their effectiveness, so it is better to get fiber through food sources.  Always check the fiber content on thenutrition facts label of any prepackaged food. Look for foods that contain at least 5 grams of fiber per serving.  Ask your dietitian if you have questions about specific foods that are related to your condition, especially if those foods are not listed in the following section.  Increase your daily fiber consumption gradually. Increasing your intake of dietary fiber too quickly may cause bloating, cramping, or gas.  Drink plenty of water. Water helps you to digest fiber. What foods can I eat? Grains Whole-grain breads. Multigrain cereal. Oats and oatmeal. Brown rice. Barley. Bulgur wheat. [place]. Bran muffins. Popcorn. Rye wafer crackers. Vegetables Sweet potatoes. Spinach. Kale. Artichokes. Cabbage. Broccoli. Green peas. Carrots. Squash. Fruits Berries. Pears. Apples. Oranges. Avocados. Prunes and raisins. Dried figs. Meats and Other Protein Sources [place], kidney, pinto, and soy beans. Split peas. Lentils. Nuts and seeds. Dairy Fiber-fortified yogurt. Beverages Fiber-fortified soy milk. Fiber-fortified  orange juice. Other Fiber bars. The items listed above may not be a complete list of recommended foods or beverages. Contact your dietitian for more options. What foods are not recommended? Grains White bread. Pasta made with refined flour. White rice. Vegetables Fried potatoes. Canned vegetables.  Well-cooked vegetables. Fruits Fruit juice. Cooked, strained fruit. Meats and Other Protein Sources Fatty cuts of meat. Fried Sales executive or fried fish. Dairy Milk. Yogurt. Cream cheese. Sour cream. Beverages Soft drinks. Other Cakes and pastries. Butter and oils. The items listed above may not be a complete list of foods and beverages to avoid. Contact your dietitian for more information. What are some tips for including high-fiber foods in my diet?  Eat a wide variety of high-fiber foods.  Make sure that half of all grains consumed each day are whole grains.  Replace breads and cereals made from refined flour or white flour with whole-grain breads and cereals.  Replace white rice with brown rice, bulgur wheat, or millet.  Start the day with a breakfast that is high in fiber, such as a cereal that contains at least 5 grams of fiber per serving.  Use beans in place of meat in soups, salads, or pasta.  Eat high-fiber snacks, such as berries, raw vegetables, nuts, or popcorn. This information is not intended to replace advice given to you by your health care provider. Make sure you discuss any questions you have with your health care provider. Document Released: 07/10/2005 Document Revised: 12/16/2015 Document Reviewed: 12/23/2013 Elsevier Interactive Patient Education  2017 [employer].   Palpitations A palpitation is the feeling that your heartbeat is irregular or is faster than normal. It may feel like your heart is fluttering or skipping a beat. Palpitations are usually not a serious problem. They may be caused by many things, including smoking, caffeine, alcohol, stress, and  certain medicines. Although most causes of palpitations are not serious, palpitations can be a sign of a serious medical problem. In some cases, you may need further medical evaluation. Follow these instructions at home: Pay attention to any changes in your symptoms. Take these actions to help with your condition:  Avoid the following: ? Caffeinated coffee, tea, soft drinks, diet pills, and energy drinks. ? Chocolate. ? Alcohol.  Do not use any tobacco products, such as cigarettes, chewing tobacco, and e-cigarettes. If you need help quitting, ask your health care provider.  Try to reduce your stress and anxiety. Things that can help you relax include: ? Yoga. ? Meditation. ? Physical activity, such as swimming, jogging, or walking. ? Biofeedback. This is a method that helps you learn to use your mind to control things in your body, such as your heartbeats.  Get plenty of rest and sleep.  Take over-the-counter and prescription medicines only as told by your health care provider.  Keep all follow-up visits as told by your health care provider. This is important.  Contact a health care provider if:  You continue to have a fast or irregular heartbeat after 24 hours.  Your palpitations occur more often. Get help right away if:  You have chest pain or shortness of breath.  You have a severe headache.  You feel dizzy or you faint. This information is not intended to replace advice given to you by your health care provider. Make sure you discuss any questions you have with your health care provider. Document Released: 07/07/2000 Document Revised: 12/13/2015 Document Reviewed: 03/25/2015 Elsevier Interactive Patient Education  2017 [employer].    IF you received an x-ray today, you will receive an invoice from [facility]. Please contact [facility] at [phone] with questions or concerns regarding your invoice.   IF you received labwork today, you will  receive an invoice from [facility]. Please contact [facility] at [phone] with  questions or concerns regarding your invoice.   Our billing staff will not be able to assist you with questions regarding bills from these companies.  You will be contacted with the lab results as soon as they are available. The fastest way to get your results is to activate your My Chart account. Instructions are located on the last page of this paperwork. If you have not heard from [place] regarding the results in 2 weeks, please contact this office.       I personally performed the services described in this documentation, which was scribed in my presence. The recorded information has been reviewed and considered for accuracy and completeness, addended by me as needed, and agree with information above.  Signed,   Merri Ray, MD [facility].  02/02/17 3:04 PM

## 2017-02-02 LAB — LIPID PANEL
CHOLESTEROL TOTAL: 165 mg/dL (ref 100–199)
Chol/HDL Ratio: 3.4 ratio (ref 0.0–5.0)
HDL: 49 mg/dL (ref >39)
LDL CALC: 97 mg/dL (ref 0–99)
Triglycerides: 93 mg/dL (ref 0–149)
VLDL Cholesterol Cal: 19 mg/dL (ref 5–40)

## 2017-02-02 LAB — COMPREHENSIVE METABOLIC PANEL
A/G RATIO: 1.8 (ref 1.2–2.2)
ALBUMIN: 4.7 g/dL (ref 3.5–5.5)
ALK PHOS: 56 IU/L (ref 39–117)
ALT: 11 IU/L (ref 0–44)
AST: 16 IU/L (ref 0–40)
BILIRUBIN TOTAL: 1.1 mg/dL (ref 0.0–1.2)
BUN / CREAT RATIO: 9 (ref 9–20)
BUN: 8 mg/dL (ref 6–24)
CO2: 24 mmol/L (ref 20–29)
CREATININE: 0.93 mg/dL (ref 0.76–1.27)
Calcium: 9.6 mg/dL (ref 8.7–10.2)
Chloride: 100 mmol/L (ref 96–106)
GFR calc Af Amer: 115 mL/min/{1.73_m2} (ref >59)
GFR calc non Af Amer: 99 mL/min/{1.73_m2} (ref >59)
GLOBULIN, TOTAL: 2.6 g/dL (ref 1.5–4.5)
Glucose: 79 mg/dL (ref 65–99)
POTASSIUM: 4.1 mmol/L (ref 3.5–5.2)
SODIUM: 139 mmol/L (ref 134–144)
Total Protein: 7.3 g/dL (ref 6.0–8.5)

## 2017-02-02 LAB — TSH: TSH: 1.78 u[IU]/mL (ref 0.450–4.500)

## 2017-02-10 ENCOUNTER — Encounter: Payer: Self-pay | Admitting: Family Medicine

## 2017-02-13 ENCOUNTER — Telehealth: Payer: Self-pay

## 2017-02-27 NOTE — Telephone Encounter (Signed)
error 

## 2017-03-22 ENCOUNTER — Ambulatory Visit: Payer: BLUE CROSS/BLUE SHIELD | Admitting: Family Medicine

## 2018-02-04 ENCOUNTER — Other Ambulatory Visit: Payer: Self-pay

## 2018-02-04 ENCOUNTER — Encounter: Payer: Self-pay | Admitting: Family Medicine

## 2018-02-04 ENCOUNTER — Ambulatory Visit (INDEPENDENT_AMBULATORY_CARE_PROVIDER_SITE_OTHER): Payer: BLUE CROSS/BLUE SHIELD | Admitting: Family Medicine

## 2018-02-04 VITALS — BP 112/70 | HR 61 | Temp 98.4°F | Ht 73.0 in | Wt 180.0 lb

## 2018-02-04 DIAGNOSIS — Z131 Encounter for screening for diabetes mellitus: Secondary | ICD-10-CM

## 2018-02-04 DIAGNOSIS — Z13220 Encounter for screening for lipoid disorders: Secondary | ICD-10-CM | POA: Diagnosis not present

## 2018-02-04 DIAGNOSIS — Z8042 Family history of malignant neoplasm of prostate: Secondary | ICD-10-CM

## 2018-02-04 DIAGNOSIS — Z125 Encounter for screening for malignant neoplasm of prostate: Secondary | ICD-10-CM | POA: Diagnosis not present

## 2018-02-04 DIAGNOSIS — Z0000 Encounter for general adult medical examination without abnormal findings: Secondary | ICD-10-CM | POA: Diagnosis not present

## 2018-02-04 NOTE — Progress Notes (Signed)
Subjective:  By signing my name below, I, Moises Blood, attest that this documentation has been prepared under the direction and in the presence of Merri Ray, MD. Electronically Signed: Moises Blood, [place]. 02/04/2018 , 8:28 AM .  Patient was seen in Room 10 .   Patient ID: Chad Griffin, male    DOB: 01-13-1971, 47 y.o.   MRN: [id number] Chief Complaint  Patient presents with  . Annual Exam    CPE   HPI Chad Griffin is a 47 y.o. male Here for annual physical. He was last seen in July 2018. He is fasting today. He denies known family history of thyroid problems.   Cancer Screening Colon cancer screening: family history of colon cancer in maternal grandfather in his 62s. Patient has had history of constipation, and was referred to GI last year. He saw Dr. Collene Mares in August 2018, recommended UltraFlora qd, and Linzess for constipation as well as fiber intake. Then, he was treated with Xifaxan.   Prostate cancer screening: family history of prostate cancer in paternal grandfather. He agrees to both PSA blood test and DRE today.   Color changes - scalp He mentions feeling a few bumps over his scalp with some discoloration. He denies regular follow ups with dermatologist. He did a round of skin cream previously. He denies history or known family history of skin cancers, but he has had a few spots removed.   STI testing He denies STI testing.   Immunizations Immunization History  Administered Date(s) Administered  . Influenza,inj,Quad PF,6+ Mos 08/13/2015, 04/07/2016  . Tdap 04/22/2010    Depression Depression screen [facility] 2/9 02/04/2018 02/01/2017 09/07/2016 01/20/2016 08/13/2015  Decreased Interest 0 0 0 0 0  Down, Depressed, Hopeless 0 0 0 0 0  PHQ - 2 Score 0 0 0 0 0    Vision  Visual Acuity Screening   Right eye Left eye Both eyes  Without correction: 20/13 20/13 20/13   With correction:      He last saw eye doctor over 20 years ago. He denies wearing corrective  lenses or contacts.   Dentist He is followed by a dentist, followed about every 6 months.   Exercise He exercises about 3 days a week.   There are no active problems to display for this patient.  Past Medical History:  Diagnosis Date  . Allergy    No past surgical history on file. Allergies  Allergen Reactions  . Penicillins Swelling   Prior to Admission medications   Not on File   Social History   Socioeconomic History  . Marital status: Single    Spouse name: Not on file  . Number of children: Not on file  . Years of education: Not on file  . Highest education level: Not on file  Occupational History  . Occupation: Network engineer  Social Needs  . Financial resource strain: Not on file  . Food insecurity:    Worry: Not on file    Inability: Not on file  . Transportation needs:    Medical: Not on file    Non-medical: Not on file  Tobacco Use  . Smoking status: Never Smoker  . Smokeless tobacco: Never Used  Substance and Sexual Activity  . Alcohol use: Yes    Alcohol/week: 0.0 oz    Comment: BEER  . Drug use: No  . Sexual activity: Not on file  Lifestyle  . Physical activity:    Days per week: Not on file    Minutes per session: Not  on file  . Stress: Not on file  Relationships  . Social connections:    Talks on phone: Not on file    Gets together: Not on file    Attends religious service: Not on file    Active member of club or organization: Not on file    Attends meetings of clubs or organizations: Not on file    Relationship status: Not on file  . Intimate partner violence:    Fear of current or ex partner: Not on file    Emotionally abused: Not on file    Physically abused: Not on file    Forced sexual activity: Not on file  Other Topics Concern  . Not on file  Social History Narrative   Exercise work in the yard, yoga and swimming   Review of Systems 13 point ROS - positive for color changes of skin, otherwise negative     Objective:    Physical Exam  Constitutional: He is oriented to person, place, and time. He appears well-developed and well-nourished.  HENT:  Head: Normocephalic and atraumatic.  Right Ear: External ear normal.  Left Ear: External ear normal.  Mouth/Throat: Oropharynx is clear and moist.  Few scattered hyperpigmented nevi, light tan in appearance, few elevated areas Right frontal scalp, approximately 5-6 mm by 1 cm slightly hyperpigmented patch, flat  Eyes: Pupils are equal, round, and reactive to light. Conjunctivae and EOM are normal.  Neck: Normal range of motion. Neck supple. No thyromegaly present.  Cardiovascular: Normal rate, regular rhythm, normal heart sounds and intact distal pulses.  Pulmonary/Chest: Effort normal and breath sounds normal. No respiratory distress. He has no wheezes.  Abdominal: Soft. He exhibits no distension. There is no tenderness. Hernia confirmed negative in the right inguinal area and confirmed negative in the left inguinal area.  Genitourinary: Prostate normal.  Musculoskeletal: Normal range of motion. He exhibits no edema or tenderness.  Lymphadenopathy:    He has no cervical adenopathy.  Neurological: He is alert and oriented to person, place, and time. He has normal reflexes.  Skin: Skin is warm and dry.  Psychiatric: He has a normal mood and affect. His behavior is normal.  Vitals reviewed.  Vitals:   02/04/18 0801  BP: 112/70  Pulse: 61  Temp: 98.4 F (36.9 C)  TempSrc: Oral  SpO2: 99%  Weight: 180 lb (81.6 kg)  Height: 6\' 1"  (1.854 m)      Assessment & Plan:  Chad Griffin is a 47 y.o. male Annual physical exam - Plan: Lipid panel, Comprehensive metabolic panel  - anticipatory guidance as below in AVS, screening labs above. Health maintenance items as above in HPI discussed/recommended as applicable.   - can discuss timing of colonoscopy with GI given FH, but in grandfather at older age.   -few nevi on scalp, asked that he call his dermatologist for  follow up and routine screening.   Screening for hyperlipidemia - Plan: Lipid panel  Screening for diabetes mellitus - Plan: Comprehensive metabolic panel  Screening for prostate cancer - Plan: PSA Family history of prostate cancer - Plan: PSA  - We discussed pros and cons of prostate cancer screening, and after this discussion, he chose to have screening done. PSA obtained, and no concerning findings on DRE.    No orders of the defined types were placed in this encounter.  Patient Instructions   I would recommend continued follow up with dermatologist for scalp issues and routine skin screening.    Call your gastroenterologist  to see if colonoscopy is recommended sooner with your family history.   Thanks for coming in today.   Keeping you healthy  Get these tests  Blood pressure- Have your blood pressure checked once a year by your healthcare provider.  Normal blood pressure is 120/80.  Weight- Have your body mass index (BMI) calculated to screen for obesity.  BMI is a measure of body fat based on height and weight. You can also calculate your own BMI at [employer]velBags.it.  Cholesterol- Have your cholesterol checked regularly starting at age 65, sooner may be necessary if you have diabetes, high blood pressure, if a family member developed heart diseases at an early age or if you smoke.   Chlamydia, HIV, and other sexual transmitted disease- Get screened each year until the age of 64 then within three months of each new sexual partner.  Diabetes- Have your blood sugar checked regularly if you have high blood pressure, high cholesterol, a family history of diabetes or if you are overweight.  Get these vaccines  Flu shot- Every fall.  Tetanus shot- Every 10 years.  Menactra- Single dose; prevents meningitis.  Take these steps  Don't smoke- If you do smoke, ask your healthcare provider about quitting. For tips on how to quit, go to www.smokefree.gov or call  1-800-QUIT-NOW.  Be physically active- Exercise 5 days a week for at least 30 minutes.  If you are not already physically active start slow and gradually work up to 30 minutes of moderate physical activity.  Examples of moderate activity include walking briskly, mowing the yard, dancing, swimming bicycling, etc.  Eat a healthy diet- Eat a variety of healthy foods such as fruits, vegetables, low fat milk, low fat cheese, yogurt, lean meats, poultry, fish, beans, tofu, etc.  For more information on healthy eating, go to www.thenutritionsource.org  Drink alcohol in moderation- Limit alcohol intake two drinks or less a day.  Never drink and drive.  Dentist- Brush and floss teeth twice daily; visit your dentis twice a year.  Depression-Your emotional health is as important as your physical health.  If you're feeling down, losing interest in things you normally enjoy please talk with your healthcare provider.  Gun Safety- If you keep a gun in your home, keep it unloaded and with the safety lock on.  Bullets should be stored separately.  Helmet use- Always wear a helmet when riding a motorcycle, bicycle, rollerblading or skateboarding.  Safe sex- If you may be exposed to a sexually transmitted infection, use a condom  Seat belts- Seat bels can save your life; always wear one.  Smoke/Carbon Monoxide detectors- These detectors need to be installed on the appropriate level of your home.  Replace batteries at least once a year.  Skin Cancer- When out in the sun, cover up and use sunscreen SPF 15 or higher.  Violence- If anyone is threatening or hurting you, please tell your healthcare provider.   IF you received an x-ray today, you will receive an invoice from [facility] Rad[facility]. Please contact [facility] Radiology at [phone] with questions or concerns regarding your invoice.   IF you received labwork today, you will receive an invoice from [place]. Please contact [facility] at [phone]  with questions or concerns regarding your invoice.   Our billing staff will not be able to assist you with questions regarding bills from these companies.  You will be contacted with the lab results as soon as they are available. The fastest way to get your results is to activate  your My Chart account. Instructions are located on the last page of this paperwork. If you have not heard from [place] regarding the results in 2 weeks, please contact this office.       I personally performed the services described in this documentation, which was scribed in my presence. The recorded information has been reviewed and considered for accuracy and completeness, addended by me as needed, and agree with information above.  Signed,   Merri Ray, MD [facility] at [place].  02/04/18 8:39 AM

## 2018-02-04 NOTE — Patient Instructions (Addendum)
I would recommend continued follow up with dermatologist for scalp issues and routine skin screening.    Call your gastroenterologist to see if colonoscopy is recommended sooner with your family history.   Thanks for coming in today.   Keeping you healthy  Get these tests  Blood pressure- Have your blood pressure checked once a year by your healthcare provider.  Normal blood pressure is 120/80.  Weight- Have your body mass index (BMI) calculated to screen for obesity.  BMI is a measure of body fat based on height and weight. You can also calculate your own BMI at [employer].  Cholesterol- Have your cholesterol checked regularly starting at age 10, sooner may be necessary if you have diabetes, high blood pressure, if a family member developed heart diseases at an early age or if you smoke.   Chlamydia, HIV, and other sexual transmitted disease- Get screened each year until the age of 83 then within three months of each new sexual partner.  Diabetes- Have your blood sugar checked regularly if you have high blood pressure, high cholesterol, a family history of diabetes or if you are overweight.  Get these vaccines  Flu shot- Every fall.  Tetanus shot- Every 10 years.  Menactra- Single dose; prevents meningitis.  Take these steps  Don't smoke- If you do smoke, ask your healthcare provider about quitting. For tips on how to quit, go to www.smokefree.gov or call 1-800-QUIT-NOW.  Be physically active- Exercise 5 days a week for at least 30 minutes.  If you are not already physically active start slow and gradually work up to 30 minutes of moderate physical activity.  Examples of moderate activity include walking briskly, mowing the yard, dancing, swimming bicycling, etc.  Eat a healthy diet- Eat a variety of healthy foods such as fruits, vegetables, low fat milk, low fat cheese, yogurt, lean meats, poultry, fish, beans, tofu, etc.  For more information on healthy eating, go  to www.thenutritionsource.org  Drink alcohol in moderation- Limit alcohol intake two drinks or less a day.  Never drink and drive.  Dentist- Brush and floss teeth twice daily; visit your dentis twice a year.  Depression-Your emotional health is as important as your physical health.  If you're feeling down, losing interest in things you normally enjoy please talk with your healthcare provider.  Gun Safety- If you keep a gun in your home, keep it unloaded and with the safety lock on.  Bullets should be stored separately.  Helmet use- Always wear a helmet when riding a motorcycle, bicycle, rollerblading or skateboarding.  Safe sex- If you may be exposed to a sexually transmitted infection, use a condom  Seat belts- Seat bels can save your life; always wear one.  Smoke/Carbon Monoxide detectors- These detectors need to be installed on the appropriate level of your home.  Replace batteries at least once a year.  Skin Cancer- When out in the sun, cover up and use sunscreen SPF 15 or higher.  Violence- If anyone is threatening or hurting you, please tell your healthcare provider.   IF you received an x-ray today, you will receive an invoice from [facility] Radiology. Please contact [facility] Radiology at [phone] with questions or concerns regarding your invoice.   IF you received labwork today, you will receive an invoice from [place]. Please contact [facility] at [phone] with questions or concerns regarding your invoice.   Our billing staff will not be able to assist you with questions regarding bills from these companies.  You will be contacted  with the lab results as soon as they are available. The fastest way to get your results is to activate your My Chart account. Instructions are located on the last page of this paperwork. If you have not heard from [place] regarding the results in 2 weeks, please contact this office.

## 2018-02-05 LAB — COMPREHENSIVE METABOLIC PANEL
A/G RATIO: 1.8 (ref 1.2–2.2)
ALK PHOS: 51 IU/L (ref 39–117)
ALT: 19 IU/L (ref 0–44)
AST: 22 IU/L (ref 0–40)
Albumin: 4.5 g/dL (ref 3.5–5.5)
BUN/Creatinine Ratio: 12 (ref 9–20)
BUN: 12 mg/dL (ref 6–24)
Bilirubin Total: 0.9 mg/dL (ref 0.0–1.2)
CALCIUM: 9.4 mg/dL (ref 8.7–10.2)
CO2: 23 mmol/L (ref 20–29)
CREATININE: 1.02 mg/dL (ref 0.76–1.27)
Chloride: 101 mmol/L (ref 96–106)
GFR calc Af Amer: 102 mL/min/{1.73_m2} (ref >59)
GFR calc non Af Amer: 88 mL/min/{1.73_m2} (ref >59)
Globulin, Total: 2.5 g/dL (ref 1.5–4.5)
Glucose: 88 mg/dL (ref 65–99)
POTASSIUM: 4.2 mmol/L (ref 3.5–5.2)
Sodium: 138 mmol/L (ref 134–144)
Total Protein: 7 g/dL (ref 6.0–8.5)

## 2018-02-05 LAB — PSA: PROSTATE SPECIFIC AG, SERUM: 1 ng/mL (ref 0.0–4.0)

## 2018-02-05 LAB — LIPID PANEL
CHOL/HDL RATIO: 3 ratio (ref 0.0–5.0)
Cholesterol, Total: 156 mg/dL (ref 100–199)
HDL: 52 mg/dL (ref >39)
LDL CALC: 90 mg/dL (ref 0–99)
Triglycerides: 71 mg/dL (ref 0–149)
VLDL CHOLESTEROL CAL: 14 mg/dL (ref 5–40)

## 2018-03-07 ENCOUNTER — Ambulatory Visit: Payer: Self-pay | Admitting: Family Medicine

## 2018-03-07 NOTE — Telephone Encounter (Signed)
Pt has gash in hand. Pt would like to know if he needs another tetanus. Please advise Cb#[phone] or [phone]   Patient called to see if he needs Tdap repeat- 2011. Cut triaged and is clean and dry. Patient states office does not need to call him back if booster is not needed at this time. Patient notified normal booster is 10 years- will send for provider review.  Reason for Disposition . Minor cut or scratch  Answer Assessment - Initial Assessment Questions 1. APPEARANCE of INJURY: "What does the injury look like?"      Clean and dry- using antibiotic cream- not deep- no need for stitches 2. SIZE: "How large is the cut?"      1 1/2 inch 3. BLEEDING: "Is it bleeding now?" If so, ask: "Is it difficult to stop?"      no 4. LOCATION: "Where is the injury located?"      Left thumb- close to hand at base of thumb 5. ONSET: "How long ago did the injury occur?"      yesterday 6. MECHANISM: "Tell me how it happened."      Working and scraped with screwdriver 7. TETANUS: "When was the last tetanus booster?"     2011 8. PREGNANCY: "Is there any chance you are pregnant?" "When was your last menstrual period?"     n/a  Protocols used: [place]

## 2018-03-08 NOTE — Telephone Encounter (Signed)
Confirmed with Philis Fendt,  PA - pt is within the 10 year window for TDap. LMOVM he does not need a booster at this time after his cut/scratch on his thumb.

## 2018-03-27 ENCOUNTER — Other Ambulatory Visit: Payer: Self-pay | Admitting: Dermatology

## 2019-02-06 ENCOUNTER — Other Ambulatory Visit: Payer: Self-pay | Admitting: Nurse Practitioner

## 2019-02-06 DIAGNOSIS — H65193 Other acute nonsuppurative otitis media, bilateral: Secondary | ICD-10-CM

## 2019-02-06 DIAGNOSIS — H6690 Otitis media, unspecified, unspecified ear: Secondary | ICD-10-CM | POA: Insufficient documentation

## 2019-02-06 MED ORDER — AZITHROMYCIN 250 MG PO TABS: ORAL_TABLET | ORAL | 0 refills | Status: AC

## 2019-02-06 MED ORDER — CIPRODEX 0.3-0.1 % OT SUSP: 4.0000 [drp] | Freq: Two times a day (BID) | OTIC | 0 refills | Status: AC

## 2019-03-03 ENCOUNTER — Ambulatory Visit: Payer: BC Managed Care – PPO | Admitting: Family Medicine

## 2019-03-03 ENCOUNTER — Encounter: Payer: Self-pay | Admitting: Family Medicine

## 2019-03-03 ENCOUNTER — Other Ambulatory Visit (HOSPITAL_COMMUNITY)
Admission: RE | Admit: 2019-03-03 | Discharge: 2019-03-03 | Disposition: A | Discharge status: Home / Self Care | Payer: BC Managed Care – PPO | Source: Ambulatory Visit | Attending: Family Medicine | Admitting: Family Medicine

## 2019-03-03 ENCOUNTER — Other Ambulatory Visit: Payer: Self-pay

## 2019-03-03 VITALS — BP 117/75 | HR 62 | Temp 98.6°F | Resp 14 | Wt 179.8 lb

## 2019-03-03 DIAGNOSIS — Z113 Encounter for screening for infections with a predominantly sexual mode of transmission: Secondary | ICD-10-CM | POA: Diagnosis not present

## 2019-03-03 NOTE — Patient Instructions (Addendum)
   sti testing obtained. Let me know if there are questions, or other testing requested.   Follow up for physical in next 6 months if possible.    If you have lab work done today you will be contacted with your lab results within the next 2 weeks.  If you have not heard from [place] then please contact us. The fastest way to get your results is to register for My Chart.   IF you received an x-ray today, you will receive an invoice from [facility] Radiology. Please contact [facility] Radiology at [phone] with questions or concerns regarding your invoice.   IF you received labwork today, you will receive an invoice from [place]. Please contact [facility] at [phone] with questions or concerns regarding your invoice.   Our billing staff will not be able to assist you with questions regarding bills from these companies.  You will be contacted with the lab results as soon as they are available. The fastest way to get your results is to activate your My Chart account. Instructions are located on the last page of this paperwork. If you have not heard from [place] regarding the results in 2 weeks, please contact this office.

## 2019-03-03 NOTE — Progress Notes (Signed)
Subjective:    Patient ID: Chad Griffin, male    DOB: 1971-05-20, 48 y.o.   MRN: [id number]  HPI Chad Griffin is a 48 y.o. male Presents today for: Chief Complaint  Patient presents with  . Exposure to STD    Here for std check. No symptom   Here for STI screening.  Asked to donate sperm - part of protocol. Did not specify which test.  Occasional cold sore, but no genital herpes   Has rare possible cold sore - 2-3 times year.   No current genital rash/discharge. No current dysuria.   No prior sti.   Immunization History  Administered Date(s) Administered  . Influenza,inj,Quad PF,6+ Mos 08/13/2015, 04/07/2016  . Tdap 04/22/2010     Patient Active Problem List   Diagnosis Date Noted  . Otitis media 02/06/2019   Past Medical History:  Diagnosis Date  . Allergy    No past surgical history on file. Allergies  Allergen Reactions  . Penicillins Swelling   Prior to Admission medications   Not on File   Social History   Socioeconomic History  . Marital status: Single    Spouse name: Not on file  . Number of children: Not on file  . Years of education: Not on file  . Highest education level: Not on file  Occupational History  . Occupation: Network engineer  Social Needs  . Financial resource strain: Not on file  . Food insecurity    Worry: Not on file    Inability: Not on file  . Transportation needs    Medical: Not on file    Non-medical: Not on file  Tobacco Use  . Smoking status: Never Smoker  . Smokeless tobacco: Never Used  Substance and Sexual Activity  . Alcohol use: Yes    Alcohol/week: 0.0 standard drinks    Comment: BEER  . Drug use: No  . Sexual activity: Not on file  Lifestyle  . Physical activity    Days per week: Not on file    Minutes per session: Not on file  . Stress: Not on file  Relationships  . Social Herbalist on phone: Not on file    Gets together: Not on file    Attends religious service: Not on file    Active  member of club or organization: Not on file    Attends meetings of clubs or organizations: Not on file    Relationship status: Not on file  . Intimate partner violence    Fear of current or ex partner: Not on file    Emotionally abused: Not on file    Physically abused: Not on file    Forced sexual activity: Not on file  Other Topics Concern  . Not on file  Social History Narrative   Exercise work in the yard, yoga and swimming    Review of Systems     Objective:   Physical Exam Constitutional:      General: He is not in acute distress.    Appearance: He is well-developed.  HENT:     Head: Normocephalic and atraumatic.  Cardiovascular:     Rate and Rhythm: Normal rate.  Pulmonary:     Effort: Pulmonary effort is normal.  Neurological:     Mental Status: He is alert and oriented to person, place, and time.    Vitals:   03/03/19 1109  BP: 117/75  Pulse: 62  Resp: 14  Temp: 98.6 F (37 C)  TempSrc:  Oral  SpO2: 98%  Weight: 179 lb 12.8 oz (81.6 kg)  '       Assessment & Plan:  Chad Griffin is a 48 y.o. male Routine screening for STI (sexually transmitted infection) - Plan: HIV Antibody (routine testing w rflx), RPR, GC/Chlamydia probe amp ([place])not at [facility]  -Asymptomatic.  Routine STI screening performed.  If other testing needed for the donation facility, can let me know and I can place a lab only order.  Plan for routine physical next 6 months.  No orders of the defined types were placed in this encounter.  Patient Instructions     sti testing obtained. Let me know if there are questions, or other testing requested.   Follow up for physical in next 6 months if possible.    If you have lab work done today you will be contacted with your lab results within the next 2 weeks.  If you have not heard from [place] then please contact us. The fastest way to get your results is to register for My Chart.   IF you received an x-ray today, you will receive  an invoice from [facility] Radiology. Please contact [facility] Radiology at [phone] with questions or concerns regarding your invoice.   IF you received labwork today, you will receive an invoice from [place]. Please contact [facility] at [phone] with questions or concerns regarding your invoice.   Our billing staff will not be able to assist you with questions regarding bills from these companies.  You will be contacted with the lab results as soon as they are available. The fastest way to get your results is to activate your My Chart account. Instructions are located on the last page of this paperwork. If you have not heard from [place] regarding the results in 2 weeks, please contact this office.       Signed,   Merri Ray, MD [facility] at [place].  03/03/19 12:32 PM

## 2019-03-04 LAB — HIV ANTIBODY (ROUTINE TESTING W REFLEX): HIV Screen 4th Generation wRfx: NONREACTIVE

## 2019-03-04 LAB — GC/CHLAMYDIA PROBE AMP (~~LOC~~) NOT AT ARMC
Chlamydia: NEGATIVE
Neisseria Gonorrhea: NEGATIVE

## 2019-03-04 LAB — RPR: RPR Ser Ql: NONREACTIVE

## 2019-05-28 ENCOUNTER — Other Ambulatory Visit: Payer: Self-pay | Admitting: Cardiology

## 2019-05-28 DIAGNOSIS — Z20822 Contact with and (suspected) exposure to covid-19: Secondary | ICD-10-CM

## 2019-05-30 LAB — NOVEL CORONAVIRUS, NAA: SARS-CoV-2, NAA: NOT DETECTED

## 2019-12-09 ENCOUNTER — Other Ambulatory Visit: Payer: Self-pay

## 2019-12-09 ENCOUNTER — Ambulatory Visit: Payer: BC Managed Care – PPO | Admitting: Dermatology

## 2019-12-09 ENCOUNTER — Encounter: Payer: Self-pay | Admitting: Dermatology

## 2019-12-09 DIAGNOSIS — D225 Melanocytic nevi of trunk: Secondary | ICD-10-CM

## 2019-12-09 DIAGNOSIS — D229 Melanocytic nevi, unspecified: Secondary | ICD-10-CM

## 2019-12-09 DIAGNOSIS — L570 Actinic keratosis: Secondary | ICD-10-CM

## 2019-12-13 ENCOUNTER — Encounter: Payer: Self-pay | Admitting: Dermatology

## 2019-12-13 NOTE — Progress Notes (Signed)
   Follow-Up Visit   Subjective  Chad Griffin is a 49 y.o. male who presents for the following: Annual Exam (f/u scalp-picato- good reaction).  Actinic keratoses Location: Scalp Duration:  Quality: Improved Associated Signs/Symptoms: Modifying Factors: Picato Severity:  Timing:  Context:   The following portions of the chart were reviewed this encounter and updated as appropriate: Tobacco  Allergies  Meds  Problems  Med Hx  Surg Hx  Fam Hx      Objective  Well appearing patient in no apparent distress; mood and affect are within normal limits.  All skin waist up examined.   Assessment & Plan  AK (actinic keratosis) Mid Parietal Scalp  General skin examination 1 year  Nevus Mid Back

## 2020-02-16 ENCOUNTER — Other Ambulatory Visit: Payer: Self-pay

## 2020-02-16 ENCOUNTER — Ambulatory Visit
Admission: EM | Admit: 2020-02-16 | Discharge: 2020-02-16 | Disposition: A | Discharge status: Home / Self Care | Payer: BC Managed Care – PPO | Attending: Family Medicine | Admitting: Family Medicine

## 2020-02-16 ENCOUNTER — Encounter: Payer: Self-pay | Admitting: Emergency Medicine

## 2020-02-16 DIAGNOSIS — J029 Acute pharyngitis, unspecified: Secondary | ICD-10-CM

## 2020-02-16 LAB — POCT RAPID STREP A (OFFICE): Rapid Strep A Screen: NEGATIVE

## 2020-02-16 MED ORDER — DEXAMETHASONE SODIUM PHOSPHATE 10 MG/ML IJ SOLN
10.0000 mg | Freq: Once | INTRAMUSCULAR | Status: AC
  Administered 2020-02-16: 10 mg via INTRAMUSCULAR

## 2020-02-16 NOTE — Discharge Instructions (Signed)
Steroid injection given here for swelling Your strep test was negative.  We will send it for culture and call you with any positive results. You can keep taking the ibuprofen as needed. Recommend Zyrtec daily in case this is allergy related. Follow up as needed for continued or worsening symptoms

## 2020-02-16 NOTE — ED Triage Notes (Signed)
Pt presents to [facility] for assessment of sore throat x 1 week, swelling in the last 12-24 hours.  Denies fevers, denies cough, denies nasal congestion, c/o left ear pain.

## 2020-02-16 NOTE — ED Provider Notes (Signed)
[facility]    CSN: [id number] Arrival date & time: 02/16/20  0849      History   Chief Complaint Chief Complaint  Patient presents with  . Sore Throat    HPI Declin Chad Griffin is a 49 y.o. male.   Pt is an overall healthy 49 year old male presenting with worsening tonsillar and uvula swelling. Reports symptoms began on left tonsil and have progressed to swelling on both tonsils and uvula. Reports left ear pain and pain with swallowing. Denies being around anyone who is sick. Denies fever, chills, congestion, cough, difficulty breathing. Hx tonsillitis and strep throat. Ibuprofen helped with pain.   ROS per HPI      Past Medical History:  Diagnosis Date  . Allergy     Patient Active Problem List   Diagnosis Date Noted  . Otitis media 02/06/2019    History reviewed. No pertinent surgical history.     Home Medications    Prior to Admission medications   Not on File    Family History Family History  Problem Relation Age of Onset  . Diabetes Maternal Grandmother   . Heart disease Maternal Grandfather   . Hyperlipidemia Maternal Grandfather   . Cancer Paternal Grandmother   . Heart disease Paternal Grandfather     Social History Social History   Tobacco Use  . Smoking status: Never Smoker  . Smokeless tobacco: Never Used  Substance Use Topics  . Alcohol use: Yes    Alcohol/week: 0.0 standard drinks    Comment: BEER  . Drug use: No     Allergies   Penicillins   Review of Systems Review of Systems  Constitutional: Negative.   HENT: Positive for ear pain, sore throat and trouble swallowing. Negative for congestion and drooling.        L ear pain  Eyes: Negative.   Respiratory: Negative.      Physical Exam Triage Vital Signs ED Triage Vitals  Enc Vitals Group     BP 02/16/20 0857 (!) 135/79     Pulse Rate 02/16/20 0857 56     Resp 02/16/20 0857 16     Temp 02/16/20 0857 (!) 97 F (36.1 C)     Temp Source 02/16/20 0857  Temporal     SpO2 02/16/20 0857 98 %     Weight --      Height --      Head Circumference --      Peak Flow --      Pain Score 02/16/20 0858 0     Pain Loc --      Pain Edu? --      Excl. in [place]? --    No data found.  Updated Vital Signs BP (!) 135/79 (BP Location: Left Arm)   Pulse 56   Temp (!) 97 F (36.1 C) (Temporal) Comment: Ibuprofen 2 horus ago  Resp 16   SpO2 98%   Visual Acuity Right Eye Distance:   Left Eye Distance:   Bilateral Distance:    Right Eye Near:   Left Eye Near:    Bilateral Near:     Physical Exam Constitutional:      General: He is awake. He is not in acute distress.    Appearance: Normal appearance. He is well-developed and normal weight.  HENT:     Head: Normocephalic.     Right Ear: Tympanic membrane and ear canal normal.     Left Ear: Tympanic membrane and ear canal normal.  Nose: No congestion or rhinorrhea.     Mouth/Throat:     Mouth: Mucous membranes are moist.     Pharynx: Pharyngeal swelling, posterior oropharyngeal erythema and uvula swelling present.     Tonsils: No tonsillar exudate. 2+ on the right. 3+ on the left.  Eyes:     Conjunctiva/sclera: Conjunctivae normal.  Pulmonary:     Effort: Pulmonary effort is normal.  Skin:    General: Skin is warm and dry.     Capillary Refill: Capillary refill takes less than 2 seconds.  Neurological:     General: No focal deficit present.     Mental Status: He is alert.  Psychiatric:        Mood and Affect: Mood normal.        Behavior: Behavior normal. Behavior is cooperative.      UC Treatments / Results  Labs (all labs ordered are listed, but only abnormal results are displayed) Labs Reviewed  CULTURE, GROUP A STREP [facility])  POCT RAPID STREP A (OFFICE)    EKG   Radiology No results found.  Procedures Procedures (including critical care time)  Medications Ordered in UC Medications  dexamethasone (DECADRON) injection 10 mg (10 mg Intramuscular Given 02/16/20  0940)    Initial Impression / Assessment and Plan / UC Course  I have reviewed the triage vital signs and the nursing notes.  Pertinent labs & imaging results that were available during my care of the patient were reviewed by me and considered in my medical decision making (see chart for details).     Sore throat Rapid strep test negative.  Sending for culture.  Steroid injection given here for tonsillar information swelling. Recommended doing ibuprofen as needed and Zyrtec daily Follow up as needed for continued or worsening symptoms  Final Clinical Impressions(s) / UC Diagnoses   Final diagnoses:  Sore throat     Discharge Instructions     Steroid injection given here for swelling Your strep test was negative.  We will send it for culture and call you with any positive results. You can keep taking the ibuprofen as needed. Recommend Zyrtec daily in case this is allergy related. Follow up as needed for continued or worsening symptoms     ED Prescriptions    None     PDMP not reviewed this encounter.   Orvan July, NP 02/16/20 1629

## 2020-02-18 LAB — CULTURE, GROUP A STREP (THRC)

## 2021-02-07 ENCOUNTER — Ambulatory Visit (INDEPENDENT_AMBULATORY_CARE_PROVIDER_SITE_OTHER): Payer: BC Managed Care – PPO | Admitting: Dermatology

## 2021-02-07 ENCOUNTER — Other Ambulatory Visit: Payer: Self-pay

## 2021-02-07 ENCOUNTER — Encounter: Payer: Self-pay | Admitting: Dermatology

## 2021-02-07 DIAGNOSIS — L820 Inflamed seborrheic keratosis: Secondary | ICD-10-CM | POA: Diagnosis not present

## 2021-02-07 DIAGNOSIS — L570 Actinic keratosis: Secondary | ICD-10-CM

## 2021-02-07 DIAGNOSIS — L84 Corns and callosities: Secondary | ICD-10-CM

## 2021-02-07 DIAGNOSIS — Z1283 Encounter for screening for malignant neoplasm of skin: Secondary | ICD-10-CM | POA: Diagnosis not present

## 2021-02-14 ENCOUNTER — Encounter: Payer: Self-pay | Admitting: Dermatology

## 2021-02-14 NOTE — Progress Notes (Signed)
   Follow-Up Visit   Subjective  Chad Griffin is a 50 y.o. male who presents for the following: Annual Exam (Right brow tan spot Dr Denna Haggard has seen in the past it is now itchy, no history of atypical moles or skin cancer).  Recheck right eyebrow plus several other skin issues to discuss Location:  Duration:  Quality:  Associated Signs/Symptoms: Modifying Factors:  Severity:  Timing: Context:   Objective  Well appearing patient in no apparent distress; mood and affect are within normal limits. Mid Back No atypia or skin cancer found today full body check   Right Forehead Flattopped textured tan 5 mm papules X 2 above eyebrow that sometimes itches   Right Dorsal Mid 4th Finger Right hand patient stated he bites the tissue due to stress   Right Dorsal Hand, Right Eyebrow Tan-pink gritty 4 to 7 mm crust    A focused examination was performed including head, neck, arms, back. Relevant physical exam findings are noted in the Assessment and Plan.   Assessment & Plan    Encounter for screening for malignant neoplasm of skin Mid Back  Inflamed seborrheic keratosis Right Forehead  No intervention initiated, recheck of clinical change  Callus Right Dorsal Mid 4th Finger  No treatment initiated  AK (actinic keratosis) (2) Right Dorsal Hand; Right Eyebrow  Destruction of lesion - Right Dorsal Hand, Right Eyebrow Complexity: simple   Destruction method: cryotherapy   Informed consent: discussed and consent obtained   Lesion destroyed using liquid nitrogen: Yes   Cryotherapy cycles:  3 Outcome: patient tolerated procedure well with no complications        I, Lavonna Monarch, MD, have reviewed all documentation for this visit.  The documentation on 02/14/21 for the exam, diagnosis, procedures, and orders are all accurate and complete.

## 2021-10-12 ENCOUNTER — Inpatient Hospital Stay: Payer: BC Managed Care – PPO | Admitting: Family Medicine

## 2021-11-01 ENCOUNTER — Encounter: Payer: Self-pay | Admitting: Physical Medicine & Rehabilitation

## 2022-01-24 ENCOUNTER — Encounter (HOSPITAL_COMMUNITY): Payer: Self-pay

## 2022-01-24 ENCOUNTER — Ambulatory Visit (INDEPENDENT_AMBULATORY_CARE_PROVIDER_SITE_OTHER): Payer: BC Managed Care – PPO

## 2022-01-24 ENCOUNTER — Ambulatory Visit (HOSPITAL_COMMUNITY)
Admission: EM | Admit: 2022-01-24 | Discharge: 2022-01-24 | Disposition: A | Discharge status: Home / Self Care | Payer: BC Managed Care – PPO | Attending: Internal Medicine | Admitting: Internal Medicine

## 2022-01-24 DIAGNOSIS — M79675 Pain in left toe(s): Secondary | ICD-10-CM

## 2022-01-24 DIAGNOSIS — S92422A Displaced fracture of distal phalanx of left great toe, initial encounter for closed fracture: Secondary | ICD-10-CM | POA: Diagnosis not present

## 2022-01-24 NOTE — ED Triage Notes (Signed)
Injury to the left big toe after landing wrong from a jump. Onset of Sunday. Pain increased yesterday and even more today.  Swelling and Patient states the toe is more crocked and shorter than normal for him.

## 2022-01-24 NOTE — ED Provider Notes (Signed)
[place]    CSN: [id number] Arrival date & time: 01/24/22  1632      History   Chief Complaint Chief Complaint  Patient presents with   Toe Injury    HPI Chad Griffin is a 51 y.o. male.   Patient presents with left great toe pain after an injury that occurred about 3 days ago.  Patient reports that he jumped down from high space and "landed wrong" on his toe.  He is not sure how his toe landed but he denies that it was bent underneath him.  Pain has been increasing over the past few days.  Denies any numbness or tingling.  Denies any pain in any other of the toes or foot. He has not taken any medication for pain.      Past Medical History:  Diagnosis Date   Allergy     Patient Active Problem List   Diagnosis Date Noted   Otitis media 02/06/2019    Past Surgical History:  Procedure Laterality Date   SUBDURAL HEMATOMA EVACUATION VIA CRANIOTOMY         Home Medications    Prior to Admission medications   Not on File    Family History Family History  Problem Relation Age of Onset   Diabetes Maternal Grandmother    Heart disease Maternal Grandfather    Hyperlipidemia Maternal Grandfather    Cancer Paternal Grandmother    Heart disease Paternal Grandfather     Social History Social History   Tobacco Use   Smoking status: Never   Smokeless tobacco: Never  Vaping Use   Vaping Use: Never used  Substance Use Topics   Alcohol use: Yes    Alcohol/week: 0.0 standard drinks of alcohol    Comment: BEER   Drug use: No     Allergies   Penicillins   Review of Systems Review of Systems Per HPI  Physical Exam Triage Vital Signs ED Triage Vitals  Enc Vitals Group     BP 01/24/22 1648 111/77     Pulse Rate 01/24/22 1648 67     Resp 01/24/22 1648 16     Temp 01/24/22 1648 98.3 F (36.8 C)     Temp Source 01/24/22 1648 Oral     SpO2 01/24/22 1648 100 %     Weight 01/24/22 1651 175 lb (79.4 kg)     Height 01/24/22 1651 '6\' 1"'$  (1.854  m)     Head Circumference --      Peak Flow --      Pain Score 01/24/22 1650 7     Pain Loc --      Pain Edu? --      Excl. in [place]? --    No data found.  Updated Vital Signs BP 111/77 (BP Location: Right Arm)   Pulse 67   Temp 98.3 F (36.8 C) (Oral)   Resp 16   Ht '6\' 1"'$  (1.854 m)   Wt 175 lb (79.4 kg)   SpO2 100%   BMI 23.09 kg/m   Visual Acuity Right Eye Distance:   Left Eye Distance:   Bilateral Distance:    Right Eye Near:   Left Eye Near:    Bilateral Near:     Physical Exam Constitutional:      General: He is not in acute distress.    Appearance: Normal appearance. He is not toxic-appearing or diaphoretic.  HENT:     Head: Normocephalic and atraumatic.  Eyes:     Extraocular  Movements: Extraocular movements intact.     Conjunctiva/sclera: Conjunctivae normal.  Pulmonary:     Effort: Pulmonary effort is normal.  Feet:     Comments: Mild swelling and erythema noted throughout left great toe.  Patient has limited range of motion of distal end of toe due to pain.  He can still wiggle toe.  Capillary refill and pulses normal.  No tenderness to other toes or any part of the foot. No lacerations or abrasions noted.  Neurological:     General: No focal deficit present.     Mental Status: He is alert and oriented to person, place, and time. Mental status is at baseline.  Psychiatric:        Mood and Affect: Mood normal.        Behavior: Behavior normal.        Thought Content: Thought content normal.        Judgment: Judgment normal.      UC Treatments / Results  Labs (all labs ordered are listed, but only abnormal results are displayed) Labs Reviewed - No data to display  EKG   Radiology DG Toe Great Left  Result Date: 01/24/2022 CLINICAL DATA:  Injury to the left big toe from jumping 2 days ago. Increased pain and swelling. EXAM: LEFT GREAT TOE COMPARISON:  Left foot radiograph 03/12/2012. FINDINGS: Moderate dorsal displacement of the left great toe  distal phalanx with narrowing of the IP joint space. Oblique minimally displaced fracture at the plantar aspect of the base of the left great toe distal phalanx, with the fracture Griffin extending to the articular surface, best appreciated on lateral view. Soft tissue swelling of the great toe is noted. IMPRESSION: Minimally displaced intra-articular fracture of the base of the left great toe distal phalanx. Moderate dorsal displacement of the great toe distal phalanx with narrowing of the IP joint space. Electronically Signed   By: Ileana Roup M.D.   On: 01/24/2022 17:23    Procedures Procedures (including critical care time)  Medications Ordered in UC Medications - No data to display  Initial Impression / Assessment and Plan / UC Course  I have reviewed the triage vital signs and the nursing notes.  Pertinent labs & imaging results that were available during my care of the patient were reviewed by me and considered in my medical decision making (see chart for details).     Patient has displaced fracture of left great toe.  Called Dr. Doreatha Martin with on-call orthopedist to discuss x-ray results.  He advised postop shoe with only heel weightbearing and to follow-up with orthopedist tomorrow to schedule an appointment for further evaluation and management.  Recommended to patient ice application and elevation of extremity as well.  Offered patient pain medication but he declined.  Advised patient that he may take ibuprofen as needed.  Postop shoe applied by nursing staff prior to patient discharge.  Patient verbalized understanding and was agreeable with plan. Final Clinical Impressions(s) / UC Diagnoses   Final diagnoses:  Displaced fracture of distal phalanx of left great toe, initial encounter for closed fracture     Discharge Instructions      You have broken your toe.  A postop shoe has been applied for you to wear with no weightbearing on the toes.  You may do heel weightbearing.  Apply ice  and elevate extremity as well.  You may take ibuprofen for pain.  Follow-up with provided contact information for orthopedist tomorrow as soon as possible for further evaluation  and management.     ED Prescriptions   None    PDMP not reviewed this encounter.   Teodora Medici, [place] 01/24/22 [phone]

## 2022-01-24 NOTE — Discharge Instructions (Signed)
You have broken your toe.  A postop shoe has been applied for you to wear with no weightbearing on the toes.  You may do heel weightbearing.  Apply ice and elevate extremity as well.  You may take ibuprofen for pain.  Follow-up with provided contact information for orthopedist tomorrow as soon as possible for further evaluation and management.

## 2022-02-07 ENCOUNTER — Ambulatory Visit (INDEPENDENT_AMBULATORY_CARE_PROVIDER_SITE_OTHER): Payer: BC Managed Care – PPO | Admitting: Dermatology

## 2022-02-07 ENCOUNTER — Encounter: Payer: Self-pay | Admitting: Dermatology

## 2022-02-07 DIAGNOSIS — L821 Other seborrheic keratosis: Secondary | ICD-10-CM

## 2022-02-07 DIAGNOSIS — L281 Prurigo nodularis: Secondary | ICD-10-CM | POA: Diagnosis not present

## 2022-02-07 DIAGNOSIS — D1800 Hemangioma unspecified site: Secondary | ICD-10-CM | POA: Diagnosis not present

## 2022-02-07 DIAGNOSIS — Z1283 Encounter for screening for malignant neoplasm of skin: Secondary | ICD-10-CM

## 2022-02-08 ENCOUNTER — Encounter: Payer: Self-pay | Admitting: Physical Medicine & Rehabilitation

## 2022-02-08 ENCOUNTER — Encounter
Payer: BC Managed Care – PPO | Attending: Physical Medicine & Rehabilitation | Admitting: Physical Medicine & Rehabilitation

## 2022-02-08 VITALS — BP 105/68 | HR 69 | Ht 73.0 in | Wt 177.0 lb

## 2022-02-08 DIAGNOSIS — F419 Anxiety disorder, unspecified: Secondary | ICD-10-CM | POA: Diagnosis not present

## 2022-02-08 DIAGNOSIS — S065X2S Traumatic subdural hemorrhage with loss of consciousness of 31 minutes to 59 minutes, sequela: Secondary | ICD-10-CM

## 2022-02-08 DIAGNOSIS — F40233 Fear of injury: Secondary | ICD-10-CM

## 2022-02-08 DIAGNOSIS — F411 Generalized anxiety disorder: Secondary | ICD-10-CM

## 2022-02-08 DIAGNOSIS — S065XAA Traumatic subdural hemorrhage with loss of consciousness status unknown, initial encounter: Secondary | ICD-10-CM | POA: Insufficient documentation

## 2022-02-08 NOTE — Patient Instructions (Signed)
RECOMMENDATIONS;  HAND THERAPY TO MAXIMIZE RANGE OF MOTION OF YOUR RIGHT 2ND AND 3RD FINGER FLEXORS.  TARGET 8 HOURS OF SLEEP AT NIGHT I ENCOURAGE CONTINUED MENTAL AND PHYSICAL ACTIVITY TO INCREASE YOUR STAMINA. IF YOU EVER FEEL THAT YOU'RE OVERSTIMULATED OR "OVER-TIRED", PLEASE STOP....DON'T TRY TO PUSH THROUGH. TAKE A BREAK AND RESUME THE NEXT DAY OR AT A LATER TIME CONTINUE YOUR THERAPIES WITH YOUR COUNSELOR.

## 2022-02-08 NOTE — Progress Notes (Signed)
Subjective:    Patient ID: Ollie Esty, male    DOB: July 27, 1970, 51 y.o.   MRN: [id number]  HPI  This is a initial office visit for Mr. Samba Cumba who is a 51 year old male who apparently suffered a traumatic brain injury with right subdural and epidural hematoma which required craniotomy as a result of a fall on ice. He may have tumbled down some distance.Marland Kitchen  He also suffered a right zygomatic fracture right hand fourth and fifth digit fractures?.  This occurred in [facility] on  March 05/2022 of this year.He remembers waking up at the scene of his accident.  He was discharged a week later from the hospital on [address] for seizure prophylaxis as well as Robaxin and Fioricet in addition to oxycodone. He ended up flying back to [place] later in the month.    He feels that his cognition is close to baseline. He's been much improved from a mobility deficit as well. He had been back to running again until injuring his left great toe.   Sleep is back to baseline. He denies problems with his equilibrium or vision   One problem he is experiencing since the injury is anxiety and even almost a phobia of harm or injury that is out of his control.  He gave an example of walking around outside and being afraid of a branch falling and injuring him on the head.  He bases on the fact that he was doing something fairly innocent which caused him to have this major injury.  He is working with a [facility] on some strategies to cope and desensitize, and he feels that he is making progress.     Pain Inventory Average Pain 1 Pain Right Now 1 My pain is intermittent, constant, dull, and aching  LOCATION OF PAIN  neck, fingers  BOWEL Number of stools per week: 5 Oral laxative use No  Type of laxative . Enema or suppository use No  History of colostomy No  Incontinent No   BLADDER Normal In and out cath, frequency . Able to self cath  . Bladder incontinence No  Frequent urination No  Leakage with  coughing No  Difficulty starting stream No  Incomplete bladder emptying No    Mobility walk without assistance how many minutes can you walk? 30 ability to climb steps?  yes do you drive?  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  Neuro/Psych bladder control problems tingling  Prior Studies New pt  Physicians involved in your care New pt   Family History  Problem Relation Age of Onset   Diabetes Maternal Grandmother    Heart disease Maternal Grandfather    Hyperlipidemia Maternal Grandfather    Cancer Paternal Grandmother    Heart disease Paternal Grandfather    Social History   Socioeconomic History   Marital status: Single    Spouse name: Not on file   Number of children: Not on file   Years of education: Not on file   Highest education level: Not on file  Occupational History   Occupation: PROFESSOR  Tobacco Use   Smoking status: Never   Smokeless tobacco: Never  Vaping Use   Vaping Use: Never used  Substance and Sexual Activity   Alcohol use: Yes    Alcohol/week: 0.0 standard drinks of alcohol    Comment: BEER   Drug use: No   Sexual activity: Not on file  Other Topics Concern   Not on file  Social History Narrative   Exercise work in the yard, yoga and swimming   Social Determinants of Health   Financial Resource Strain: Not on file  Food Insecurity: Not on file  Transportation Needs: Not on file  Physical Activity: Not on file  Stress: Not on file  Social Connections: Not on file   Past Surgical History:  Procedure Laterality Date   [place]     Past Medical History:  Diagnosis Date   Allergy    BP 105/68   Pulse 69   Ht '6\' 1"'$  (1.854 m)   Wt 177 lb (80.3 kg)   SpO2 94%   BMI 23.35 kg/m   Opioid Risk Score:   Fall Risk Score:  `1  Depression screen PHQ 2/9     02/08/2022    3:04 PM 02/04/2018    8:02 AM 02/01/2017    1:35 PM 09/07/2016    8:10 AM 01/20/2016     1:26 PM 08/13/2015    8:14 AM 11/13/2014    8:17 AM  Depression screen PHQ 2/9  Decreased Interest 0 0 0 0 0 0 0  Down, Depressed, Hopeless 1 0 0 0 0 0 0  PHQ - 2 Score 1 0 0 0 0 0 0  Altered sleeping 0        Tired, decreased energy 0        Change in appetite 0        Feeling bad or failure about yourself  1        Trouble concentrating 0        Moving slowly or fidgety/restless 0        Suicidal thoughts 0        PHQ-9 Score 2        Difficult doing work/chores Somewhat difficult           Review of Systems  Genitourinary:  Positive for difficulty urinating.  Musculoskeletal:  Positive for neck pain.  All other systems reviewed and are negative.     Objective:   Physical Exam  General: Alert and oriented x 3, No apparent distress HEENT: Head is normocephalic, atraumatic, PERRLA, EOMI, sclera anicteric, oral mucosa pink and moist, dentition intact, ext ear canals clear,  Neck: Supple without JVD or lymphadenopathy Heart: Reg rate and rhythm. No murmurs rubs or gallops Chest: CTA bilaterally without wheezes, rales, or rhonchi; no distress Abdomen: Soft, non-tender, non-distended, bowel sounds positive. Extremities: No clubbing, cyanosis, or edema. Pulses are 2+ Psych: Pt's affect is appropriate. Pt is cooperative Skin: Clean and intact without signs of breakdown Neuro:  Alert and oriented x 3. Normal insight and awareness. Intact Memory. Normal language and speech. Cranial nerve exam unremarkable. Motor 5/5 in all 4's. No sensory or motor deficits.  Musculoskeletal: pt lacked full flexion of DIP's and to lesser extent PIP's of digits 2 and 3 of right hand. There was pain at jts with forced flexion. Mild pain in back and abdomen with flexion of neck. Good posture and symmetry of neck and shoulder girdle. Spurling's negative. Negative facet testing. No TP's appreciated. Pt wearing a orthopedic sandal left foot        Assessment & Plan:  Hx of TBI with SDH/SAH 10/09/21 with  brief LOC. S/p craniotomy and evacuation.  Patient is essentially at his cognitive and physical baseline.  He still experiences some slight fatigue but otherwise is doing well in these areas. 2.  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  He is receiving counseling to deal with this.   Plan: Mr. Korte marginally has improved on his own.  I encouraged him to continue increasing his mental and physical stamina.  If he ever feels that he is overstimulated or "overtired", he needs to back off what he is doing.  We also discussed the importance of adequate sleep each night which should be around 8 hours or so. Continue with counseling for his phobia.  It sounds as if they are making some headway by working on some compensatory and desensitization strategies. Patient might benefit from some hand therapy to maximize range of motion of his right hand.  Since he is already seeing orthopedic surgery, I will defer to them in this regard.  Otherwise I have nothing further to offer Mr. Del[place].  He certainly may call return for follow-up if needed. Forty-five minutes of face to face patient care time were spent during this visit. All questions were encouraged and answered. Follow up with me prn.Marland Kitchen

## 2022-02-27 ENCOUNTER — Encounter: Payer: Self-pay | Admitting: Dermatology

## 2022-02-27 NOTE — Progress Notes (Signed)
   Follow-Up Visit   Subjective  Chad Griffin is a 51 y.o. male who presents for the following: Annual Exam (No new concerns).  General skin check, several areas of concern to patient Location:  Duration:  Quality:  Associated Signs/Symptoms: Modifying Factors:  Severity:  Timing: Context:   Objective  Well appearing patient in no apparent distress; mood and affect are within normal limits. Several 4 to 5 mm brown textured flattopped papules, compatible dermoscopy  Full body skin examination-no atypical pigmented lesions (all checked with dermoscopy), no nonmelanoma skin cancer.  Chest - Medial (Center) Several 2 mm smooth red dermal papules  Left Elbow - Posterior Lichenified 5 mm papule which patient knows that he scratches.    A full examination was performed including scalp, head, eyes, ears, nose, lips, neck, chest, axillae, abdomen, back, buttocks, bilateral upper extremities, bilateral lower extremities, hands, feet, fingers, toes, fingernails, and toenails. All findings within normal limits unless otherwise noted below.   Assessment & Plan    Hemangioma, unspecified site Chest - Medial [facility])  No intervention necessary  Seborrheic keratosis  Leave if stable  Prurigo nodularis Left Elbow - Posterior  I discussed treatment options, not initiated.  Encounter for screening for malignant neoplasm of skin  Annual skin examination, encouraged to self examine twice annually.  Continue ultraviolet protection.      I, Lavonna Monarch, MD, have reviewed all documentation for this visit.  The documentation on 02/27/22 for the exam, diagnosis, procedures, and orders are all accurate and complete.
